# Patient Record
Sex: Male | Born: 1965 | Race: Black or African American | Hispanic: No | Marital: Single | Smoking: Current every day smoker
Health system: Southern US, Community
[De-identification: ages and names within clinical notes are randomized; demographics above are authoritative.]

## PROBLEM LIST (undated history)

## (undated) DIAGNOSIS — I10 Essential (primary) hypertension: Secondary | ICD-10-CM

## (undated) HISTORY — PX: NO PAST SURGERIES: SHX2092

---

## 2006-02-26 ENCOUNTER — Emergency Department (HOSPITAL_COMMUNITY): Admission: EM | Admit: 2006-02-26 | Discharge: 2006-02-26 | Payer: Self-pay | Admitting: Family Medicine

## 2014-08-28 ENCOUNTER — Encounter (HOSPITAL_COMMUNITY): Payer: Self-pay | Admitting: *Deleted

## 2014-08-28 ENCOUNTER — Emergency Department (INDEPENDENT_AMBULATORY_CARE_PROVIDER_SITE_OTHER)
Admission: EM | Admit: 2014-08-28 | Discharge: 2014-08-28 | Disposition: A | Discharge status: Home / Self Care | Payer: Medicaid Other | Source: Home / Self Care | Attending: Emergency Medicine | Admitting: Emergency Medicine

## 2014-08-28 ENCOUNTER — Emergency Department (INDEPENDENT_AMBULATORY_CARE_PROVIDER_SITE_OTHER): Payer: Medicaid Other

## 2014-08-28 DIAGNOSIS — M25471 Effusion, right ankle: Secondary | ICD-10-CM

## 2014-08-28 DIAGNOSIS — M25571 Pain in right ankle and joints of right foot: Secondary | ICD-10-CM

## 2014-08-28 DIAGNOSIS — G8929 Other chronic pain: Secondary | ICD-10-CM

## 2014-08-28 MED ORDER — CEPHALEXIN 500 MG PO CAPS: 500.0000 mg | ORAL_CAPSULE | Freq: Three times a day (TID) | ORAL | Status: AC

## 2014-08-28 MED ORDER — PREDNISONE 5 MG PO TABS: ORAL_TABLET | ORAL | Status: DC

## 2014-08-28 NOTE — ED Notes (Signed)
Denies injury.  C/O swelling to right medial ankle x approx 4 wks.  Swelling noted along with erythema into distal part of lower leg.  C/O minimal pain.  Denies fevers.  States toes feel fine.

## 2014-08-28 NOTE — ED Provider Notes (Signed)
CSN: [id number]     Arrival date & time 08/28/14  1254 History   First MD Initiated Contact with Patient 08/28/14 1348     Chief Complaint  Patient presents with  . Ankle Problem   (Consider location/radiation/quality/duration/timing/severity/associated sxs/prior Treatment) HPI Comments: Brett Grant presents with a right red and swollen ankle x 4 weeks. No known injury. "Sore" developed on the right ankle for weeks. Has been doing quite a bit of walking for exercise. No fever or chills. Pain with walking or ambulating. No nausea or vomiting.   The history is provided by the patient.    History reviewed. No pertinent past medical history. History reviewed. No pertinent past surgical history. No family history on file. History  Substance Use Topics  . Smoking status: Current Every Day Smoker -- 1.00 packs/day    Types: Cigarettes  . Smokeless tobacco: Not on file  . Alcohol Use: Yes     Comment: occasionally    Review of Systems  All other systems reviewed and are negative.   Allergies  Review of patient's allergies indicates no known allergies.  Home Medications   Prior to Admission medications   Medication Sig Start Date End Date Taking? Authorizing Provider  cephALEXin (KEFLEX) 500 MG capsule Take 1 capsule (500 mg total) by mouth 3 (three) times daily. 08/28/14 09/03/14  Riki Sheer, PA-C  predniSONE (DELTASONE) 5 MG tablet 3 tablets po x 3 days, then 2 tablets po x 3 days then 1 tablet po x 3 days then stop 08/28/14   Riki Sheer, PA-C   BP 169/82 mmHg  Pulse 107  Temp(Src) 99.5 F (37.5 C) (Oral)  Resp 16  SpO2 100% Physical Exam  Constitutional: He is oriented to person, place, and time. He appears well-developed and well-nourished. No distress.  HENT:  Head: Normocephalic and atraumatic.  Pulmonary/Chest: Effort normal.  Musculoskeletal:  Right ankle edematous, with erythema and mild warmth. Pain to palpation along the lateral aspect. Full ROM with pain   Neurological: He is alert and oriented to person, place, and time.  Skin: Skin is warm. No rash noted. He is not diaphoretic. There is erythema.  Small open sore to the right foot just distal to ankle joint. Non-infectious appearing.   Psychiatric: His behavior is normal.  Nursing note and vitals reviewed.   ED Course  Procedures (including critical care time) Labs Review Labs Reviewed - No data to display  Imaging Review Dg Ankle Complete Right  08/28/2014   CLINICAL DATA:  Right ankle swelling and soreness for 4 weeks. No known injury.  EXAM: RIGHT ANKLE - COMPLETE 3+ VIEW  COMPARISON:  None.  FINDINGS: No acute bony or joint abnormality is identified. Soft tissues about the ankle appear somewhat swollen. No radiopaque foreign body or soft tissue gas collection is seen. Cortex of the distal diaphysis of the tibia anteriorly appears thickened.  IMPRESSION: Mild appearing soft tissue about the ankle without underlying bony or joint abnormality.  Partial visualization of cortical thickening in the anterior distal tibia may be secondary to old stress change or fracture.   Electronically Signed   By: Drusilla Kanner M.D.   On: 08/28/2014 14:17     MDM   1. Ankle pain, chronic, right   2. Ankle swelling, right    Xray suggest old fracture or cortical irregularity, though presentation appeared most consistent with Gout vs. Infection. He had a small sore to the top of foot which does not appear infected but concerned. Will  treat with both Prednisone and cover with antibiotic. However will need f/u on Monday to ensure no worsening; or signs of infection. He is instructed to call with any worsening or signs of infection.     Riki SheerMichelle G Mayme Profeta, PA-C 08/28/14 1454

## 2014-08-28 NOTE — Discharge Instructions (Signed)

## 2014-08-30 ENCOUNTER — Emergency Department (INDEPENDENT_AMBULATORY_CARE_PROVIDER_SITE_OTHER)
Admission: EM | Admit: 2014-08-30 | Discharge: 2014-08-30 | Disposition: A | Discharge status: Home / Self Care | Payer: Medicaid Other | Source: Home / Self Care | Attending: Family Medicine | Admitting: Family Medicine

## 2014-08-30 ENCOUNTER — Encounter (HOSPITAL_COMMUNITY): Payer: Self-pay | Admitting: Emergency Medicine

## 2014-08-30 DIAGNOSIS — L03115 Cellulitis of right lower limb: Secondary | ICD-10-CM

## 2014-08-30 NOTE — Discharge Instructions (Signed)

## 2014-08-30 NOTE — ED Provider Notes (Signed)
CSN: [id number]     Arrival date & time 08/30/14  1446 History   First MD Initiated Contact with Patient 08/30/14 1641     Chief Complaint  Patient presents with  . Wound Check   (Consider location/radiation/quality/duration/timing/severity/associated sxs/prior Treatment) HPI Comments: [facility] ankle pain, swelling & erythema. Ddx: gout vs cellulitis. Treated with prednisone and cephalexin and returns as advised for re-check. Patient reports significant improvement and taking medications as directed without difficulty.    The history is provided by the patient.    History reviewed. No pertinent past medical history. History reviewed. No pertinent past surgical history. History reviewed. No pertinent family history. History  Substance Use Topics  . Smoking status: Current Every Day Smoker -- 1.00 packs/day    Types: Cigarettes  . Smokeless tobacco: Not on file  . Alcohol Use: Yes     Comment: occasionally    Review of Systems  All other systems reviewed and are negative.   Allergies  Review of patient's allergies indicates no known allergies.  Home Medications   Prior to Admission medications   Medication Sig Start Date End Date Taking? Authorizing Provider  cephALEXin (KEFLEX) 500 MG capsule Take 1 capsule (500 mg total) by mouth 3 (three) times daily. 08/28/14 09/03/14 Yes Riki SheerMichelle G Young, PA-C  predniSONE (DELTASONE) 5 MG tablet 3 tablets po x 3 days, then 2 tablets po x 3 days then 1 tablet po x 3 days then stop 08/28/14  Yes Michelle G Young, PA-C   BP 130/90 mmHg  Pulse 87  Temp(Src) 98.6 F (37 C) (Oral)  Resp 16  SpO2 100% Physical Exam  Constitutional: He is oriented to person, place, and time. He appears well-developed and well-nourished. No distress.  HENT:  Head: Normocephalic and atraumatic.  Cardiovascular: Normal rate.   Pulmonary/Chest: Effort normal.  Musculoskeletal:       Feet:  Neurological: He is alert and oriented  to person, place, and time.  Skin: Skin is warm and dry. No rash noted. There is erythema.  Psychiatric: He has a normal mood and affect. His behavior is normal.  Nursing note and vitals reviewed.   ED Course  Procedures (including critical care time) Labs Review Labs Reviewed - No data to display  Imaging Review No results found.   MDM   1. Cellulitis of right ankle   Continue current management and return as needed.     Ria ClockJennifer Lee H Danelle Curiale, PA 08/31/14 0900

## 2014-08-30 NOTE — ED Notes (Signed)
Pt was seen on 08/28/2014 for wound. Pt is here for a wound re-check follow up

## 2014-10-16 ENCOUNTER — Emergency Department (HOSPITAL_COMMUNITY)
Admission: EM | Admit: 2014-10-16 | Discharge: 2014-10-16 | Disposition: A | Discharge status: Home / Self Care | Payer: Medicaid Other | Source: Home / Self Care | Attending: Family Medicine | Admitting: Family Medicine

## 2014-10-16 ENCOUNTER — Encounter (HOSPITAL_COMMUNITY): Payer: Self-pay | Admitting: *Deleted

## 2014-10-16 DIAGNOSIS — R600 Localized edema: Secondary | ICD-10-CM

## 2014-10-16 DIAGNOSIS — S91301A Unspecified open wound, right foot, initial encounter: Secondary | ICD-10-CM

## 2014-10-16 MED ORDER — SILVER SULFADIAZINE 1 % EX CREA
TOPICAL_CREAM | CUTANEOUS | Status: AC
  Filled 2014-10-16: qty 85

## 2014-10-16 MED ORDER — SILVER SULFADIAZINE 1 % EX CREA: 1.0000 "application " | TOPICAL_CREAM | Freq: Every day | CUTANEOUS | Status: DC

## 2014-10-16 MED ORDER — CEPHALEXIN 500 MG PO CAPS: 500.0000 mg | ORAL_CAPSULE | Freq: Four times a day (QID) | ORAL | Status: DC

## 2014-10-16 MED ORDER — SILVER SULFADIAZINE 1 % EX CREA
TOPICAL_CREAM | Freq: Once | CUTANEOUS | Status: AC
  Administered 2014-10-16: 15:00:00 via TOPICAL
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
## 2014-10-16 NOTE — Discharge Instructions (Signed)
Edema Edema is an abnormal buildup of fluids. It is more common in your legs and thighs. Painless swelling of the feet and ankles is more likely as a person ages. It also is common in looser skin, like around your eyes. HOME CARE   Keep the affected body part above the level of the heart while lying down.  Do not sit still or stand for a long time.  Do not put anything right under your knees when you lie down.  Do not wear tight clothes on your upper legs.  Exercise your legs to help the puffiness (swelling) go down.  Wear elastic bandages or support stockings as told by your doctor.  A low-salt diet may help lessen the puffiness.  Only take medicine as told by your doctor. GET HELP IF:  Treatment is not working.  You have heart, liver, or kidney disease and notice that your skin looks puffy or shiny.  You have puffiness in your legs that does not get better when you raise your legs.  You have sudden weight gain for no reason. GET HELP RIGHT AWAY IF:   You have shortness of breath or chest pain.  You cannot breathe when you lie down.  You have pain, redness, or warmth in the areas that are puffy.  You have heart, liver, or kidney disease and get edema all of a sudden.  You have a fever and your symptoms get worse all of a sudden. MAKE SURE YOU:   Understand these instructions.  Will watch your condition.  Will get help right away if you are not doing well or get worse. Document Released: 01/02/2008 Document Revised: 07/21/2013 Document Reviewed: 05/08/2013 [facility] Patient Information 2015 [place], [place]. This information is not intended to replace advice given to you by your health care provider. Make sure you discuss any questions you have with your health care provider.  Wound Care Wound care helps prevent pain and infection.  You may need a tetanus shot if:  You cannot remember when you had your last tetanus shot.  You have never had a tetanus shot.  The  injury broke your skin. If you need a tetanus shot and you choose not to have one, you may get tetanus. Sickness from tetanus can be serious. HOME CARE   Only take medicine as told by your doctor.  Clean the wound daily with mild soap and water.  Change any bandages (dressings) as told by your doctor.  Put medicated cream and a bandage on the wound as told by your doctor.  Change the bandage if it gets wet, dirty, or starts to smell.  Take showers. Do not take baths, swim, or do anything that puts your wound under water. No hot soaks.  Rest and raise (elevate) the wound until the pain and puffiness (swelling) are better.  Keep all doctor visits as told. GET HELP RIGHT AWAY IF:   Yellowish-white fluid (pus) comes from the wound.  Medicine does not lessen your pain.  There is a red streak going away from the wound.  You have a fever. MAKE SURE YOU:   Understand these instructions.  Will watch your condition.  Will get help right away if you are not doing well or get worse. Document Released: 04/24/2008 Document Revised: 10/08/2011 Document Reviewed: 11/19/2010 [facility] Patient Information 2015 [place], [place]. This information is not intended to replace advice given to you by your health care provider. Make sure you discuss any questions you have with your health care  provider.

## 2014-10-21 ENCOUNTER — Encounter (HOSPITAL_COMMUNITY): Payer: Self-pay | Admitting: *Deleted

## 2014-10-21 ENCOUNTER — Emergency Department (HOSPITAL_COMMUNITY)
Admission: EM | Admit: 2014-10-21 | Discharge: 2014-10-21 | Disposition: A | Discharge status: Home / Self Care | Payer: Medicaid Other | Source: Home / Self Care | Attending: Family Medicine | Admitting: Family Medicine

## 2014-10-21 DIAGNOSIS — IMO0001 Reserved for inherently not codable concepts without codable children: Secondary | ICD-10-CM

## 2014-10-21 DIAGNOSIS — R030 Elevated blood-pressure reading, without diagnosis of hypertension: Secondary | ICD-10-CM

## 2014-10-21 DIAGNOSIS — L03115 Cellulitis of right lower limb: Secondary | ICD-10-CM | POA: Diagnosis not present

## 2014-10-21 MED ORDER — TETANUS-DIPHTH-ACELL PERTUSSIS 5-2.5-18.5 LF-MCG/0.5 IM SUSP
INTRAMUSCULAR | Status: AC
  Filled 2014-10-21: qty 0.5

## 2014-10-21 MED ORDER — TETANUS-DIPHTH-ACELL PERTUSSIS 5-2.5-18.5 LF-MCG/0.5 IM SUSP
0.5000 mL | Freq: Once | INTRAMUSCULAR | Status: AC
  Administered 2014-10-21: 0.5 mL via INTRAMUSCULAR

## 2014-10-21 NOTE — ED Notes (Addendum)
Brother [facility]  They told him they don't accept medicaid.  He using Silvadene once a day and taking Keflex.  Brother helping with dressing changes.  Ankle and lower leg still swollen.

## 2014-10-21 NOTE — Discharge Instructions (Signed)
As we discussed, it would be my best advice for you to allow us to transfer you to [facility] [facility] tonight to seek additional evaluationt as your have done everything that was asked of you and taken medication as directed and condition has not improved. In addition, there was an abnormal finding on your xrays on Jan. 30, 2016. A portion of the bone in your lower leg was much thicker that it should be and I would recommend additional xrays to further evaluate this. Your blood pressure is also quite elevated and this also needs to be addressed as soon as possible. You have decided not to be evaluated at ER tonight, therefore, I can only encourage you to seek follow up with your primary care doctor as soon as possible.  Cellulitis Cellulitis is an infection of the skin and the tissue beneath it. The infected area is usually red and tender. Cellulitis occurs most often in the arms and lower legs.  CAUSES  Cellulitis is caused by bacteria that enter the skin through cracks or cuts in the skin. The most common types of bacteria that cause cellulitis are staphylococci and streptococci. SIGNS AND SYMPTOMS   Redness and warmth.  Swelling.  Tenderness or pain.  Fever. DIAGNOSIS  Your health care provider can usually determine what is wrong based on a physical exam. Blood tests may also be done. TREATMENT  Treatment usually involves taking an antibiotic medicine. HOME CARE INSTRUCTIONS   Take your antibiotic medicine as directed by your health care provider. Finish the antibiotic even if you start to feel better.  Keep the infected arm or leg elevated to reduce swelling.  Apply a warm cloth to the affected area up to 4 times per day to relieve pain.  Take medicines only as directed by your health care provider.  Keep all follow-up visits as directed by your health care provider. SEEK MEDICAL CARE IF:   You notice red streaks coming from the infected area.  Your red area gets larger or turns dark  in color.  Your bone or joint underneath the infected area becomes painful after the skin has healed.  Your infection returns in the same area or another area.  You notice a swollen bump in the infected area.  You develop new symptoms.  You have a fever. SEEK IMMEDIATE MEDICAL CARE IF:   You feel very sleepy.  You develop vomiting or diarrhea.  You have a general ill feeling (malaise) with muscle aches and pains. MAKE SURE YOU:   Understand these instructions.  Will watch your condition.  Will get help right away if you are not doing well or get worse. Document Released: 04/25/2005 Document Revised: 11/30/2013 Document Reviewed: 10/01/2011 [facility] Patient Information 2015 [place], [place]. This information is not intended to replace advice given to you by your health care provider. Make sure you discuss any questions you have with your health care provider.  Hypertension Hypertension, commonly called high blood pressure, is when the force of blood pumping through your arteries is too strong. Your arteries are the blood vessels that carry blood from your heart throughout your body. A blood pressure reading consists of a higher number over a lower number, such as 110/72. The higher number (systolic) is the pressure inside your arteries when your heart pumps. The lower number (diastolic) is the pressure inside your arteries when your heart relaxes. Ideally you want your blood pressure below 120/80. Hypertension forces your heart to work harder to pump blood. Your arteries may  become narrow or stiff. Having hypertension puts you at risk for heart disease, stroke, and other problems.  RISK FACTORS Some risk factors for high blood pressure are controllable. Others are not.  Risk factors you cannot control include:   Race. You may be at higher risk if you are African American.  Age. Risk increases with age.  Gender. Men are at higher risk than women before age 50 years. After age 63,  women are at higher risk than men. Risk factors you can control include:  Not getting enough exercise or physical activity.  Being overweight.  Getting too much fat, sugar, calories, or salt in your diet.  Drinking too much alcohol. SIGNS AND SYMPTOMS Hypertension does not usually cause signs or symptoms. Extremely high blood pressure (hypertensive crisis) may cause headache, anxiety, shortness of breath, and nosebleed. DIAGNOSIS  To check if you have hypertension, your health care provider will measure your blood pressure while you are seated, with your arm held at the level of your heart. It should be measured at least twice using the same arm. Certain conditions can cause a difference in blood pressure between your right and left arms. A blood pressure reading that is higher than normal on one occasion does not mean that you need treatment. If one blood pressure reading is high, ask your health care provider about having it checked again. TREATMENT  Treating high blood pressure includes making lifestyle changes and possibly taking medicine. Living a healthy lifestyle can help lower high blood pressure. You may need to change some of your habits. Lifestyle changes may include:  Following the DASH diet. This diet is high in fruits, vegetables, and whole grains. It is low in salt, red meat, and added sugars.  Getting at least 2 hours of brisk physical activity every week.  Losing weight if necessary.  Not smoking.  Limiting alcoholic beverages.  Learning ways to reduce stress. If lifestyle changes are not enough to get your blood pressure under control, your health care provider may prescribe medicine. You may need to take more than one. Work closely with your health care provider to understand the risks and benefits. HOME CARE INSTRUCTIONS  Have your blood pressure rechecked as directed by your health care provider.   Take medicines only as directed by your health care provider.  Follow the directions carefully. Blood pressure medicines must be taken as prescribed. The medicine does not work as well when you skip doses. Skipping doses also puts you at risk for problems.   Do not smoke.   Monitor your blood pressure at home as directed by your health care provider. SEEK MEDICAL CARE IF:   You think you are having a reaction to medicines taken.  You have recurrent headaches or feel dizzy.  You have swelling in your ankles.  You have trouble with your vision. SEEK IMMEDIATE MEDICAL CARE IF:  You develop a severe headache or confusion.  You have unusual weakness, numbness, or feel faint.  You have severe chest or abdominal pain.  You vomit repeatedly.  You have trouble breathing. MAKE SURE YOU:   Understand these instructions.  Will watch your condition.  Will get help right away if you are not doing well or get worse. Document Released: 07/16/2005 Document Revised: 11/30/2013 Document Reviewed: 05/08/2013 [facility] Patient Information 2015 [place], [place]. This information is not intended to replace advice given to you by your health care provider. Make sure you discuss any questions you have with your health care provider.

## 2014-10-21 NOTE — ED Provider Notes (Addendum)
CSN: [id number]     Arrival date & time 10/21/14  1833 History   First MD Initiated Contact with Patient 10/21/14 1941     Chief Complaint  Patient presents with  . Wound Check   (Consider location/radiation/quality/duration/timing/severity/associated sxs/prior Treatment) HPI Comments: Patient presents with persistent right lower leg pain, redness and swelling. Symptoms initially began in early Jan. 2016 and have persisted despite treatment with oral steroids, two rounds of oral antibiotics, silvadene dressings, elevation, warm soaks and compresses. Brother is patient's caregiver and has helped patient take care of patient. They have attempted to seek follow up care at patient's PCP office, however, office has informed them that office will no longer accept Medicaid patients.  Old records reviewed and films from Jan. 30, 2016 are notable for significant cortical thickening of tibia.   The history is provided by the patient and a relative.    History reviewed. No pertinent past medical history. History reviewed. No pertinent past surgical history. Family History  Problem Relation Age of Onset  . Cancer Mother     throat   History  Substance Use Topics  . Smoking status: Current Every Day Smoker -- 0.50 packs/day    Types: Cigarettes  . Smokeless tobacco: Not on file  . Alcohol Use: No    Review of Systems  All other systems reviewed and are negative.   Allergies  Review of patient's allergies indicates no known allergies.  Home Medications   Prior to Admission medications   Medication Sig Start Date End Date Taking? Authorizing Provider  cephALEXin (KEFLEX) 500 MG capsule Take 1 capsule (500 mg total) by mouth 4 (four) times daily. 10/16/14  Yes Hayden Rasmussenavid Mabe, NP  silver sulfADIAZINE (SILVADENE) 1 % cream Apply 1 application topically daily. 10/16/14  Yes Hayden Rasmussenavid Mabe, NP  hydrochlorothiazide (HYDRODIURIL) 25 MG tablet Take 1 tablet (25 mg total) by mouth daily. 10/22/14   Shon Batonourtney  F Horton, MD  lisinopril (PRINIVIL,ZESTRIL) 20 MG tablet Take 1 tablet (20 mg total) by mouth daily. 10/22/14   Shon Batonourtney F Horton, MD   BP 166/105 mmHg  Pulse 88  Temp(Src) 98.5 F (36.9 C) (Oral)  Resp 20  SpO2 100% Physical Exam  Constitutional: He is oriented to person, place, and time. He appears well-developed and well-nourished. No distress.  HENT:  Head: Normocephalic and atraumatic.  Eyes: Conjunctivae are normal.  Cardiovascular: Normal rate.   Pulmonary/Chest: Effort normal.  Musculoskeletal: Normal range of motion.       Right lower leg: He exhibits tenderness, swelling and edema.       Legs: Outlined area is region of 2+ tissue edema, erythema with mild induration and tenderness  Neurological: He is alert and oriented to person, place, and time.  Skin: Skin is warm and dry. There is erythema.  Psychiatric: He has a normal mood and affect. His behavior is normal.  Nursing note and vitals reviewed.   ED Course  Procedures (including critical care time) Labs Review Labs Reviewed - No data to display  Imaging Review No results found.   MDM   1. Cellulitis of right lower leg   2. Elevated blood pressure    As we discussed, it would be my best advice for you to allow us to [facility] to seek additional evaluationt as your have done everything that was asked of you and taken medication as directed and condition has not improved. In addition, there was an abnormal finding on your xrays on Jan.  30, 2016. A portion of the bone in your lower leg was much thicker that it should be and I would recommend additional xrays to further evaluate this. Your blood pressure is also quite elevated and this also needs to be addressed as soon as possible. You have decided not to be evaluated at ER tonight, therefore, I can only encourage you to seek follow up with your primary care doctor as soon as possible.   Ria Clock, [place] 10/22/14  1610  12/01/2014: Tdap administered on [address] [place], [place] 12/01/14 1217

## 2014-10-22 ENCOUNTER — Emergency Department (HOSPITAL_COMMUNITY)
Admission: EM | Admit: 2014-10-22 | Discharge: 2014-10-22 | Disposition: A | Discharge status: Home / Self Care | Payer: Medicaid Other | Attending: Emergency Medicine | Admitting: Emergency Medicine

## 2014-10-22 ENCOUNTER — Encounter (HOSPITAL_COMMUNITY): Payer: Self-pay | Admitting: Emergency Medicine

## 2014-10-22 DIAGNOSIS — Z720 Tobacco use: Secondary | ICD-10-CM | POA: Diagnosis not present

## 2014-10-22 DIAGNOSIS — I10 Essential (primary) hypertension: Secondary | ICD-10-CM | POA: Insufficient documentation

## 2014-10-22 DIAGNOSIS — Z792 Long term (current) use of antibiotics: Secondary | ICD-10-CM | POA: Insufficient documentation

## 2014-10-22 LAB — BASIC METABOLIC PANEL
Anion gap: 6 (ref 5–15)
BUN: 8 mg/dL (ref 6–23)
CO2: 27 mmol/L (ref 19–32)
CREATININE: 0.72 mg/dL (ref 0.50–1.35)
Calcium: 9.6 mg/dL (ref 8.4–10.5)
Chloride: 105 mmol/L (ref 96–112)
GFR calc non Af Amer: >90 mL/min (ref >90)
Glucose, Bld: 96 mg/dL (ref 70–99)
POTASSIUM: 3.3 mmol/L — AB (ref 3.5–5.1)
Sodium: 138 mmol/L (ref 135–145)

## 2014-10-22 MED ORDER — HYDROCHLOROTHIAZIDE 25 MG PO TABS: 25.0000 mg | ORAL_TABLET | Freq: Every day | ORAL | Status: DC

## 2014-10-22 MED ORDER — LISINOPRIL 20 MG PO TABS: 20.0000 mg | ORAL_TABLET | Freq: Every day | ORAL | Status: DC
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
## 2014-10-22 NOTE — Discharge Instructions (Signed)
You were seen today for high blood pressure. You have no symptoms related to your high blood pressure. However, your EKG does have changes likely related to hypertension.  He will be given 2 prescriptions for medications. You need to follow-up within the next week with her primary doctor. He will only be given 15 days of medication.  Hypertension Hypertension, commonly called high blood pressure, is when the force of blood pumping through your arteries is too strong. Your arteries are the blood vessels that carry blood from your heart throughout your body. A blood pressure reading consists of a higher number over a lower number, such as 110/72. The higher number (systolic) is the pressure inside your arteries when your heart pumps. The lower number (diastolic) is the pressure inside your arteries when your heart relaxes. Ideally you want your blood pressure below 120/80. Hypertension forces your heart to work harder to pump blood. Your arteries may become narrow or stiff. Having hypertension puts you at risk for heart disease, stroke, and other problems.  RISK FACTORS Some risk factors for high blood pressure are controllable. Others are not.  Risk factors you cannot control include:   Race. You may be at higher risk if you are African American.  Age. Risk increases with age.  Gender. Men are at higher risk than women before age 48 years. After age 48, women are at higher risk than men. Risk factors you can control include:  Not getting enough exercise or physical activity.  Being overweight.  Getting too much fat, sugar, calories, or salt in your diet.  Drinking too much alcohol. SIGNS AND SYMPTOMS Hypertension does not usually cause signs or symptoms. Extremely high blood pressure (hypertensive crisis) may cause headache, anxiety, shortness of breath, and nosebleed. DIAGNOSIS  To check if you have hypertension, your health care provider will measure your blood pressure while you are  seated, with your arm held at the level of your heart. It should be measured at least twice using the same arm. Certain conditions can cause a difference in blood pressure between your right and left arms. A blood pressure reading that is higher than normal on one occasion does not mean that you need treatment. If one blood pressure reading is high, ask your health care provider about having it checked again. TREATMENT  Treating high blood pressure includes making lifestyle changes and possibly taking medicine. Living a healthy lifestyle can help lower high blood pressure. You may need to change some of your habits. Lifestyle changes may include:  Following the DASH diet. This diet is high in fruits, vegetables, and whole grains. It is low in salt, red meat, and added sugars.  Getting at least 2 hours of brisk physical activity every week.  Losing weight if necessary.  Not smoking.  Limiting alcoholic beverages.  Learning ways to reduce stress. If lifestyle changes are not enough to get your blood pressure under control, your health care provider may prescribe medicine. You may need to take more than one. Work closely with your health care provider to understand the risks and benefits. HOME CARE INSTRUCTIONS  Have your blood pressure rechecked as directed by your health care provider.   Take medicines only as directed by your health care provider. Follow the directions carefully. Blood pressure medicines must be taken as prescribed. The medicine does not work as well when you skip doses. Skipping doses also puts you at risk for problems.   Do not smoke.   Monitor your blood pressure at  home as directed by your health care provider. SEEK MEDICAL CARE IF:   You think you are having a reaction to medicines taken.  You have recurrent headaches or feel dizzy.  You have swelling in your ankles.  You have trouble with your vision. SEEK IMMEDIATE MEDICAL CARE IF:  You develop a  severe headache or confusion.  You have unusual weakness, numbness, or feel faint.  You have severe chest or abdominal pain.  You vomit repeatedly.  You have trouble breathing. MAKE SURE YOU:   Understand these instructions.  Will watch your condition.  Will get help right away if you are not doing well or get worse. Document Released: 07/16/2005 Document Revised: 11/30/2013 Document Reviewed: 05/08/2013 [facility] Patient Information 2015 [place], [place]. This information is not intended to replace advice given to you by your health care provider. Make sure you discuss any questions you have with your health care provider.
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
## 2014-11-01 NOTE — Progress Notes (Signed)
11/01/2014: Addendum to DOS 10/21/2014. Patient given Tdap booster for tetanus prophylaxis on DOS given existing wounds and unknown immunization status.

## 2014-11-04 ENCOUNTER — Emergency Department (HOSPITAL_COMMUNITY)
Admission: EM | Admit: 2014-11-04 | Discharge: 2014-11-04 | Disposition: A | Discharge status: Home / Self Care | Payer: Medicaid Other | Attending: Emergency Medicine | Admitting: Emergency Medicine

## 2014-11-04 ENCOUNTER — Encounter (HOSPITAL_COMMUNITY): Payer: Self-pay | Admitting: Physical Medicine and Rehabilitation

## 2014-11-04 ENCOUNTER — Emergency Department (HOSPITAL_COMMUNITY): Payer: Medicaid Other

## 2014-11-04 DIAGNOSIS — Z792 Long term (current) use of antibiotics: Secondary | ICD-10-CM | POA: Diagnosis not present

## 2014-11-04 DIAGNOSIS — Z720 Tobacco use: Secondary | ICD-10-CM | POA: Insufficient documentation

## 2014-11-04 DIAGNOSIS — M25571 Pain in right ankle and joints of right foot: Secondary | ICD-10-CM | POA: Diagnosis not present

## 2014-11-04 DIAGNOSIS — M79671 Pain in right foot: Secondary | ICD-10-CM | POA: Diagnosis not present

## 2014-11-04 DIAGNOSIS — I10 Essential (primary) hypertension: Secondary | ICD-10-CM | POA: Insufficient documentation

## 2014-11-04 DIAGNOSIS — I159 Secondary hypertension, unspecified: Secondary | ICD-10-CM

## 2014-11-04 DIAGNOSIS — Z79899 Other long term (current) drug therapy: Secondary | ICD-10-CM | POA: Insufficient documentation

## 2014-11-04 DIAGNOSIS — Z760 Encounter for issue of repeat prescription: Secondary | ICD-10-CM | POA: Diagnosis not present

## 2014-11-04 DIAGNOSIS — R52 Pain, unspecified: Secondary | ICD-10-CM

## 2014-11-04 HISTORY — DX: Essential (primary) hypertension: I10

## 2014-11-04 MED ORDER — HYDROCHLOROTHIAZIDE 25 MG PO TABS: 25.0000 mg | ORAL_TABLET | Freq: Every day | ORAL | Status: DC

## 2014-11-04 NOTE — ED Notes (Signed)
Pt presents to department for evaluation of medication refll (HCTZ). Also states L ankle pain, denies recent injury. Pt ambulatory to triage. NAD.

## 2014-11-04 NOTE — ED Notes (Signed)
Patient in radiology
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
## 2014-11-17 ENCOUNTER — Ambulatory Visit: Payer: Medicaid Other | Attending: Family Medicine | Admitting: Family Medicine

## 2014-11-17 ENCOUNTER — Encounter: Payer: Self-pay | Admitting: Family Medicine

## 2014-11-17 ENCOUNTER — Encounter: Payer: Self-pay | Admitting: *Deleted

## 2014-11-17 VITALS — BP 162/93 | HR 77 | Temp 97.7°F | Resp 18 | Ht 71.0 in | Wt 151.8 lb

## 2014-11-17 DIAGNOSIS — F17210 Nicotine dependence, cigarettes, uncomplicated: Secondary | ICD-10-CM | POA: Insufficient documentation

## 2014-11-17 DIAGNOSIS — R609 Edema, unspecified: Secondary | ICD-10-CM | POA: Diagnosis present

## 2014-11-17 DIAGNOSIS — M25471 Effusion, right ankle: Secondary | ICD-10-CM

## 2014-11-17 DIAGNOSIS — I10 Essential (primary) hypertension: Secondary | ICD-10-CM | POA: Diagnosis not present

## 2014-11-17 DIAGNOSIS — L539 Erythematous condition, unspecified: Secondary | ICD-10-CM | POA: Insufficient documentation

## 2014-11-17 DIAGNOSIS — M7989 Other specified soft tissue disorders: Secondary | ICD-10-CM

## 2014-11-17 DIAGNOSIS — Z720 Tobacco use: Secondary | ICD-10-CM | POA: Diagnosis not present

## 2014-11-17 LAB — COMPLETE METABOLIC PANEL WITH GFR
ALBUMIN: 4.4 g/dL (ref 3.5–5.2)
ALT: 14 U/L (ref 0–53)
AST: 17 U/L (ref 0–37)
Alkaline Phosphatase: 51 U/L (ref 39–117)
BUN: 8 mg/dL (ref 6–23)
CALCIUM: 9.7 mg/dL (ref 8.4–10.5)
CO2: 26 mEq/L (ref 19–32)
Chloride: 100 mEq/L (ref 96–112)
Creat: 0.71 mg/dL (ref 0.50–1.35)
GFR, Est African American: >89 mL/min
GFR, Est Non African American: >89 mL/min
GLUCOSE: 86 mg/dL (ref 70–99)
POTASSIUM: 3.6 meq/L (ref 3.5–5.3)
Sodium: 139 mEq/L (ref 135–145)
Total Bilirubin: 0.4 mg/dL (ref 0.2–1.2)
Total Protein: 7.1 g/dL (ref 6.0–8.3)

## 2014-11-17 LAB — CBC WITH DIFFERENTIAL/PLATELET
Basophils Absolute: 0 10*3/uL (ref 0.0–0.1)
Basophils Relative: 0 % (ref 0–1)
Eosinophils Absolute: 0.1 10*3/uL (ref 0.0–0.7)
Eosinophils Relative: 1 % (ref 0–5)
HEMATOCRIT: 38.8 % — AB (ref 39.0–52.0)
Hemoglobin: 13.3 g/dL (ref 13.0–17.0)
LYMPHS PCT: 15 % (ref 12–46)
Lymphs Abs: 1.8 10*3/uL (ref 0.7–4.0)
MCH: 29.4 pg (ref 26.0–34.0)
MCHC: 34.3 g/dL (ref 30.0–36.0)
MCV: 85.8 fL (ref 78.0–100.0)
MONOS PCT: 5 % (ref 3–12)
MPV: 8.6 fL (ref 8.6–12.4)
Monocytes Absolute: 0.6 10*3/uL (ref 0.1–1.0)
NEUTROS ABS: 9.4 10*3/uL — AB (ref 1.7–7.7)
NEUTROS PCT: 79 % — AB (ref 43–77)
Platelets: 358 10*3/uL (ref 150–400)
RBC: 4.52 MIL/uL (ref 4.22–5.81)
RDW: 12.9 % (ref 11.5–15.5)
WBC: 11.9 10*3/uL — AB (ref 4.0–10.5)

## 2014-11-17 LAB — LIPID PANEL
Cholesterol: 127 mg/dL (ref 0–200)
HDL: 52 mg/dL (ref >=40)
LDL CALC: 61 mg/dL (ref 0–99)
Total CHOL/HDL Ratio: 2.4 Ratio
Triglycerides: 72 mg/dL (ref <150)
VLDL: 14 mg/dL (ref 0–40)

## 2014-11-17 LAB — POCT GLYCOSYLATED HEMOGLOBIN (HGB A1C): HEMOGLOBIN A1C: 6

## 2014-11-17 LAB — C-REACTIVE PROTEIN: CRP: 0.5 mg/dL (ref <0.60)

## 2014-11-17 LAB — URIC ACID: Uric Acid, Serum: 6.2 mg/dL (ref 4.0–7.8)

## 2014-11-17 LAB — GLUCOSE, POCT (MANUAL RESULT ENTRY): POC GLUCOSE: 105 mg/dL — AB (ref 70–99)

## 2014-11-17 MED ORDER — AMLODIPINE BESYLATE 10 MG PO TABS: 10.0000 mg | ORAL_TABLET | Freq: Every day | ORAL | Status: DC

## 2014-11-17 MED ORDER — TERBINAFINE HCL 1 % EX CREA: 1.0000 "application " | TOPICAL_CREAM | Freq: Two times a day (BID) | CUTANEOUS | Status: DC

## 2014-11-17 MED ORDER — METHYLPREDNISOLONE 4 MG PO TBPK: ORAL_TABLET | ORAL | Status: DC

## 2014-11-17 MED ORDER — NAPROXEN 500 MG PO TABS: 500.0000 mg | ORAL_TABLET | Freq: Two times a day (BID) | ORAL | Status: DC

## 2014-11-17 NOTE — Progress Notes (Signed)
Lower extremity duplex right ordered on 11/17/14. Prior authorization needed.  [facility] [id number] effective 11/17/14-12/17/14.  Vascular called and given authorization number.  Patient scheduled for 11/18/14 at 1500. Patient aware of appointment time.

## 2014-11-17 NOTE — Progress Notes (Signed)
Patient here to establish care. Patient with multiple ED/Urgent Care visits for right ankle edema. Patient has had ankle edema x 3 months. No injury to ankle. Right ankle edema improved but remains. Per patient, he has completed 2 rounds of antibiotics. Uses ice to reduce swelling. Started on hydrochlorothiazide for elevated BP on 11/04/14.  Patient compliant with medication. BP elevated today 162/91.
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
## 2014-11-17 NOTE — Assessment & Plan Note (Addendum)
A: s/p 7 days course of keflex x 2 in 08/28/14  and 10/13/14. Evidence of maceration between toes concerning for tinea pedis P: Plan for today: Lab work Stop HCTZ in case this is gout Start steroid taper  Start naproxen 500 mg twice daily with food for next 7 days  lamisil cream  Venous ltrasound of leg ordered

## 2014-11-17 NOTE — Patient Instructions (Addendum)
Mr. Brett Grant,  Thank you for coming in today. It was a pleasure meeting you. I look forward to being your primary doctor.  1. R leg swelling for 3 months  Treated twice with antibiotics  Plan for today: Lab work Stop HCTZ in case this is gout Start steroid taper  Start naproxen 500 mg twice daily with food for next 7 days  Ultrasound of leg ordered   2. HTN: Goal BP < 140/90 at all times Start norvasc 10 mg daily   3. Smoking cessation support: smoking cessation hotline: 1-800-QUIT-NOW.  Smoking cessation classes are available through [facility]. Call [phone] or visit our website at [email or web address].  F/u in 3 weeks for R leg swelling   Dr. Armen PickupFunches

## 2014-11-17 NOTE — Assessment & Plan Note (Signed)
HTN: above goal Goal BP < 140/90 at all times  P: Stop HCTZ 25 mg  Start norvasc 10 mg daily

## 2014-11-17 NOTE — Assessment & Plan Note (Signed)
Smoking cessation support: smoking cessation hotline: 1-800-QUIT-NOW.  Smoking cessation classes are available through [facility]. Call [phone] or visit our website at [email or web address].   

## 2014-11-18 ENCOUNTER — Ambulatory Visit (HOSPITAL_COMMUNITY): Payer: Medicaid Other | Attending: Family Medicine

## 2014-11-18 LAB — SEDIMENTATION RATE: SED RATE: 12 mm/h (ref 0–15)

## 2014-11-19 ENCOUNTER — Telehealth: Payer: Self-pay | Admitting: *Deleted

## 2014-11-19 NOTE — Telephone Encounter (Signed)
Left voice message with Malvin for Pt to return call

## 2014-11-19 NOTE — Telephone Encounter (Signed)
-----   Message from Dessa PhiJosalyn Funches, MD sent at 11/19/2014  8:50 AM EDT ----- Normal labs, No evidence of gout, normal inflammatory markers  WBC slightly elevated  Patient needs venous dopplers.

## 2014-12-08 ENCOUNTER — Ambulatory Visit: Payer: Medicaid Other | Attending: Family Medicine | Admitting: Family Medicine

## 2014-12-08 ENCOUNTER — Encounter: Payer: Self-pay | Admitting: Family Medicine

## 2014-12-08 VITALS — BP 144/84 | HR 75 | Temp 97.5°F | Resp 16 | Ht 71.0 in | Wt 153.0 lb

## 2014-12-08 DIAGNOSIS — H6120 Impacted cerumen, unspecified ear: Secondary | ICD-10-CM | POA: Insufficient documentation

## 2014-12-08 DIAGNOSIS — H6123 Impacted cerumen, bilateral: Secondary | ICD-10-CM

## 2014-12-08 DIAGNOSIS — M7989 Other specified soft tissue disorders: Secondary | ICD-10-CM | POA: Insufficient documentation

## 2014-12-08 DIAGNOSIS — I10 Essential (primary) hypertension: Secondary | ICD-10-CM

## 2014-12-08 LAB — CBC
HCT: 39.8 % (ref 39.0–52.0)
Hemoglobin: 13.4 g/dL (ref 13.0–17.0)
MCH: 29.1 pg (ref 26.0–34.0)
MCHC: 33.7 g/dL (ref 30.0–36.0)
MCV: 86.3 fL (ref 78.0–100.0)
MPV: 8.8 fL (ref 8.6–12.4)
Platelets: 306 10*3/uL (ref 150–400)
RBC: 4.61 MIL/uL (ref 4.22–5.81)
RDW: 14.4 % (ref 11.5–15.5)
WBC: 9.7 10*3/uL (ref 4.0–10.5)

## 2014-12-08 MED ORDER — CARBAMIDE PEROXIDE 6.5 % OT SOLN: 5.0000 [drp] | Freq: Two times a day (BID) | OTIC | Status: DC
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
## 2014-12-08 NOTE — Progress Notes (Signed)
F/U Rt ankle swelling Still with pain Stated "can walk to the store with out much pain" Hx Tobacco- 3-4 cigarette per day

## 2014-12-08 NOTE — Patient Instructions (Addendum)
Mr. Ashley RoyaltyMatthews,  Thank you for coming in today  1. R leg swelling Repeat CBC and D dimer today Please have LE doppler done to check for blood clot  Continue naproxen as needed for pain Elevate legs when sitting   2. HTN: improved BP. Continue norvasc 10 mg once daily.  Work on quitting smoking completely Smoking cessation support: smoking cessation hotline: 1-800-QUIT-NOW.  Smoking cessation classes are available through [facility]. Call [phone] or visit our website at [email or web address].   3. Wax in ears 5 days of debrox Please do not  use of q tips  You will be called with lab results  F/u in 1-2 weeks with RN for ear flush  F/u with me in 6 weeks   Dr. Armen PickupFunches

## 2014-12-08 NOTE — Assessment & Plan Note (Addendum)
A: persistent swelling w/o fever despite course of prednisone followed by naproxen. Patient reports minimal pain.  Labs: No evidence of gout, normal inflammatory markers  WBC slightly elevated  Venous doppler: still pending, DNKA for doppler   P: Repeat CBC D dimer  Venous doppler

## 2014-12-08 NOTE — Assessment & Plan Note (Signed)
A: improved, near goal P:  Continue norvasc 10 mg daily  Smoking cessation counseling

## 2014-12-08 NOTE — Assessment & Plan Note (Signed)
A: wax in both ears P: 5 days of debrox Advised patient against the use of q tips F/u in 1-2 weeks with RN for ear flush

## 2014-12-09 ENCOUNTER — Encounter: Payer: Self-pay | Admitting: *Deleted

## 2014-12-09 ENCOUNTER — Other Ambulatory Visit: Payer: Self-pay | Admitting: Family Medicine

## 2014-12-09 DIAGNOSIS — M7989 Other specified soft tissue disorders: Secondary | ICD-10-CM

## 2014-12-09 LAB — D-DIMER, QUANTITATIVE (NOT AT ARMC): D DIMER QUANT: 0.51 ug{FEU}/mL — AB (ref 0.00–0.48)

## 2014-12-09 MED ORDER — ASPIRIN EC 325 MG PO TBEC: 325.0000 mg | DELAYED_RELEASE_TABLET | Freq: Every day | ORAL | Status: DC

## 2014-12-09 NOTE — Assessment & Plan Note (Signed)
Slightly elevated D dimer in setting of chronic R leg swelling DVT suspected  LE venous doppler has been scheduled. Patient should stop naproxen and start daily aspirin 325 mg while awaiting venous doppler

## 2014-12-09 NOTE — Progress Notes (Signed)
Patient ID: Brett KassJohn N Grant, male   DOB: [birth year], 49 y.o.   MRN: [id number]  Prior Approval for LE doppler is [id number]

## 2014-12-13 ENCOUNTER — Telehealth: Payer: Self-pay | Admitting: *Deleted

## 2014-12-13 NOTE — Telephone Encounter (Signed)
Doppler schedule at [facility] on May 19 at 10:00 Pt aware of appointment

## 2014-12-16 ENCOUNTER — Ambulatory Visit (HOSPITAL_COMMUNITY): Admission: RE | Admit: 2014-12-16 | Payer: Medicaid Other | Source: Ambulatory Visit

## 2014-12-22 ENCOUNTER — Telehealth: Payer: Self-pay | Admitting: *Deleted

## 2014-12-22 NOTE — Telephone Encounter (Signed)
-----   Message from Dessa PhiJosalyn Funches, MD sent at 12/09/2014  3:11 PM EDT ----- Slightly elevated D dimer in setting of chronic R leg swelling DVT suspected  LE venous doppler has been scheduled. Patient should stop naproxen and start daily aspirin 325 mg while awaiting venous doppler

## 2014-12-22 NOTE — Telephone Encounter (Signed)
-----   Message from Dessa PhiJosalyn Funches, MD sent at 12/09/2014  8:24 AM EDT ----- Normal CBC, normal WBC

## 2014-12-22 NOTE — Telephone Encounter (Signed)
Spoke with brother give lab results Pt missed doppler appointment  No reason given Called pt did not pick-up, unable to LVM Pt # [phone]

## 2014-12-30 ENCOUNTER — Ambulatory Visit: Payer: Medicaid Other

## 2015-03-06 IMAGING — DX DG ANKLE COMPLETE 3+V*R*
3 series · 3 of 3 positions shown · non-contrast
Comparison: None.

CLINICAL DATA: Right ankle swelling and soreness for 4 weeks. No
known injury.

EXAM:
RIGHT ANKLE - COMPLETE 3+ VIEW

[ankle ap]
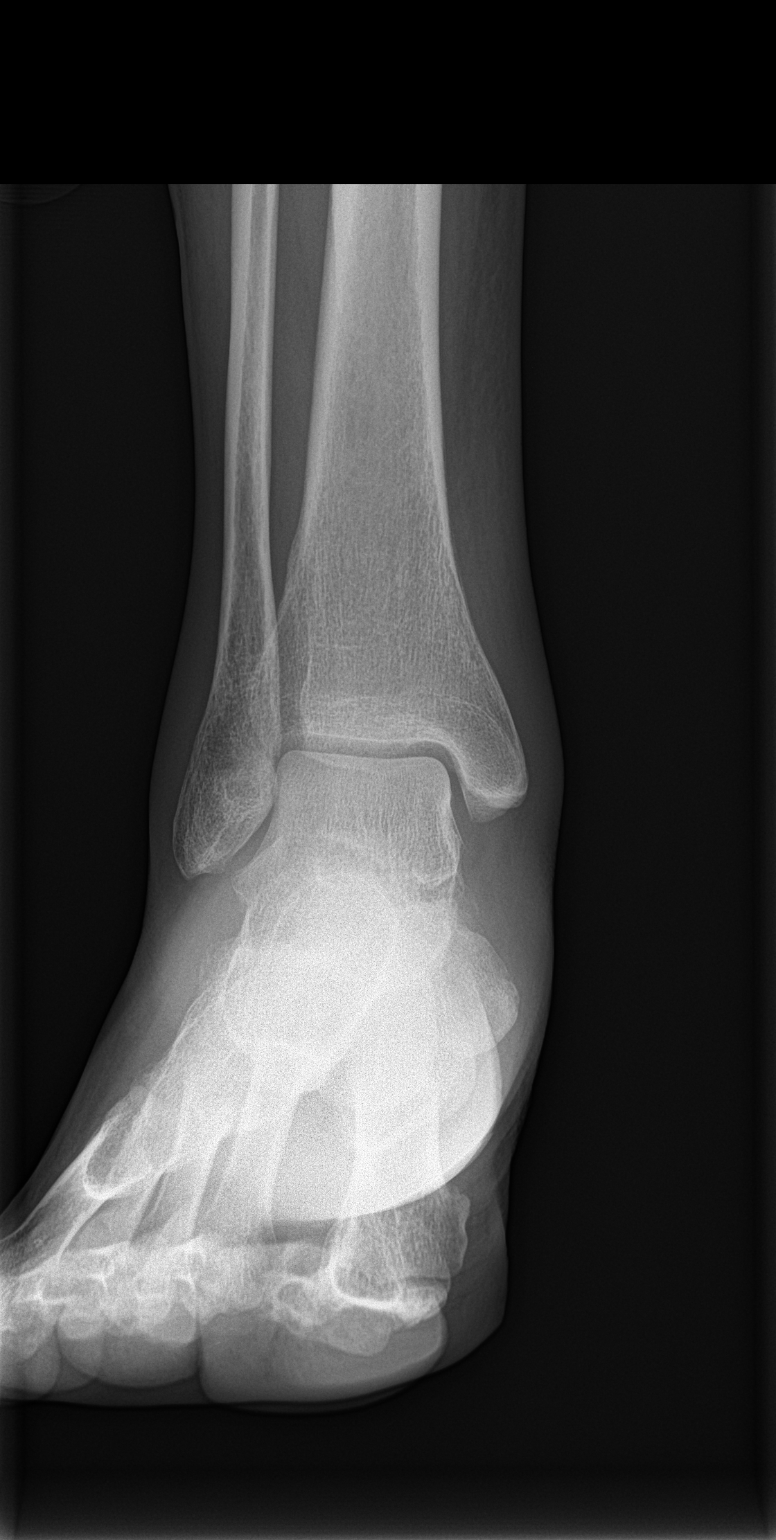

[ankle obl]
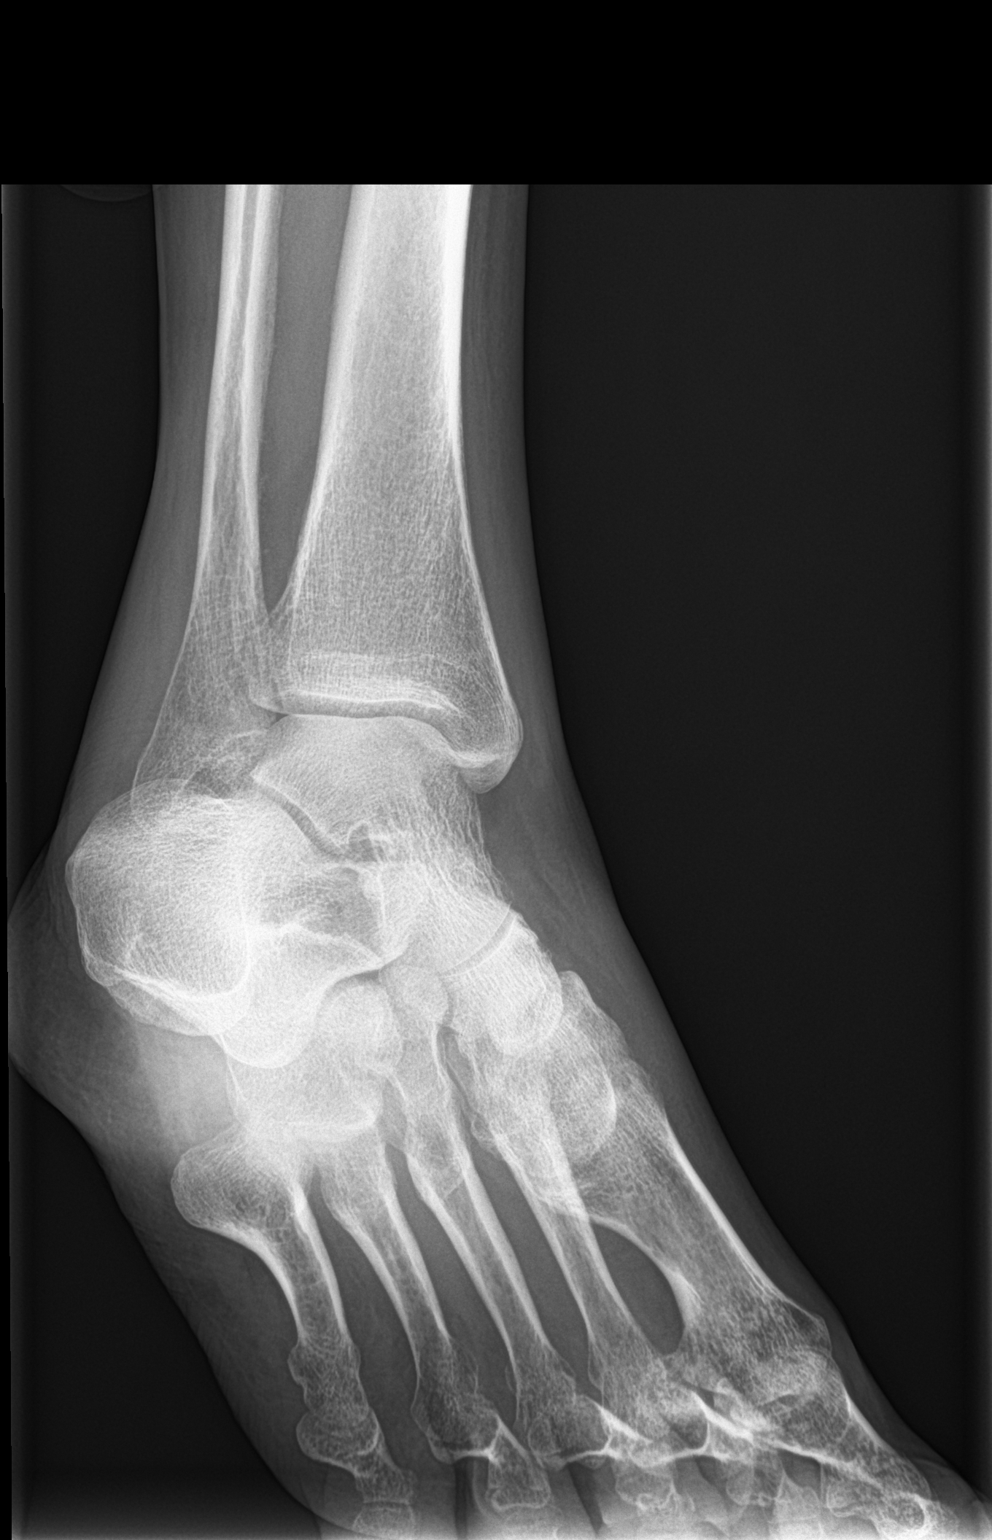

[ankle lat]
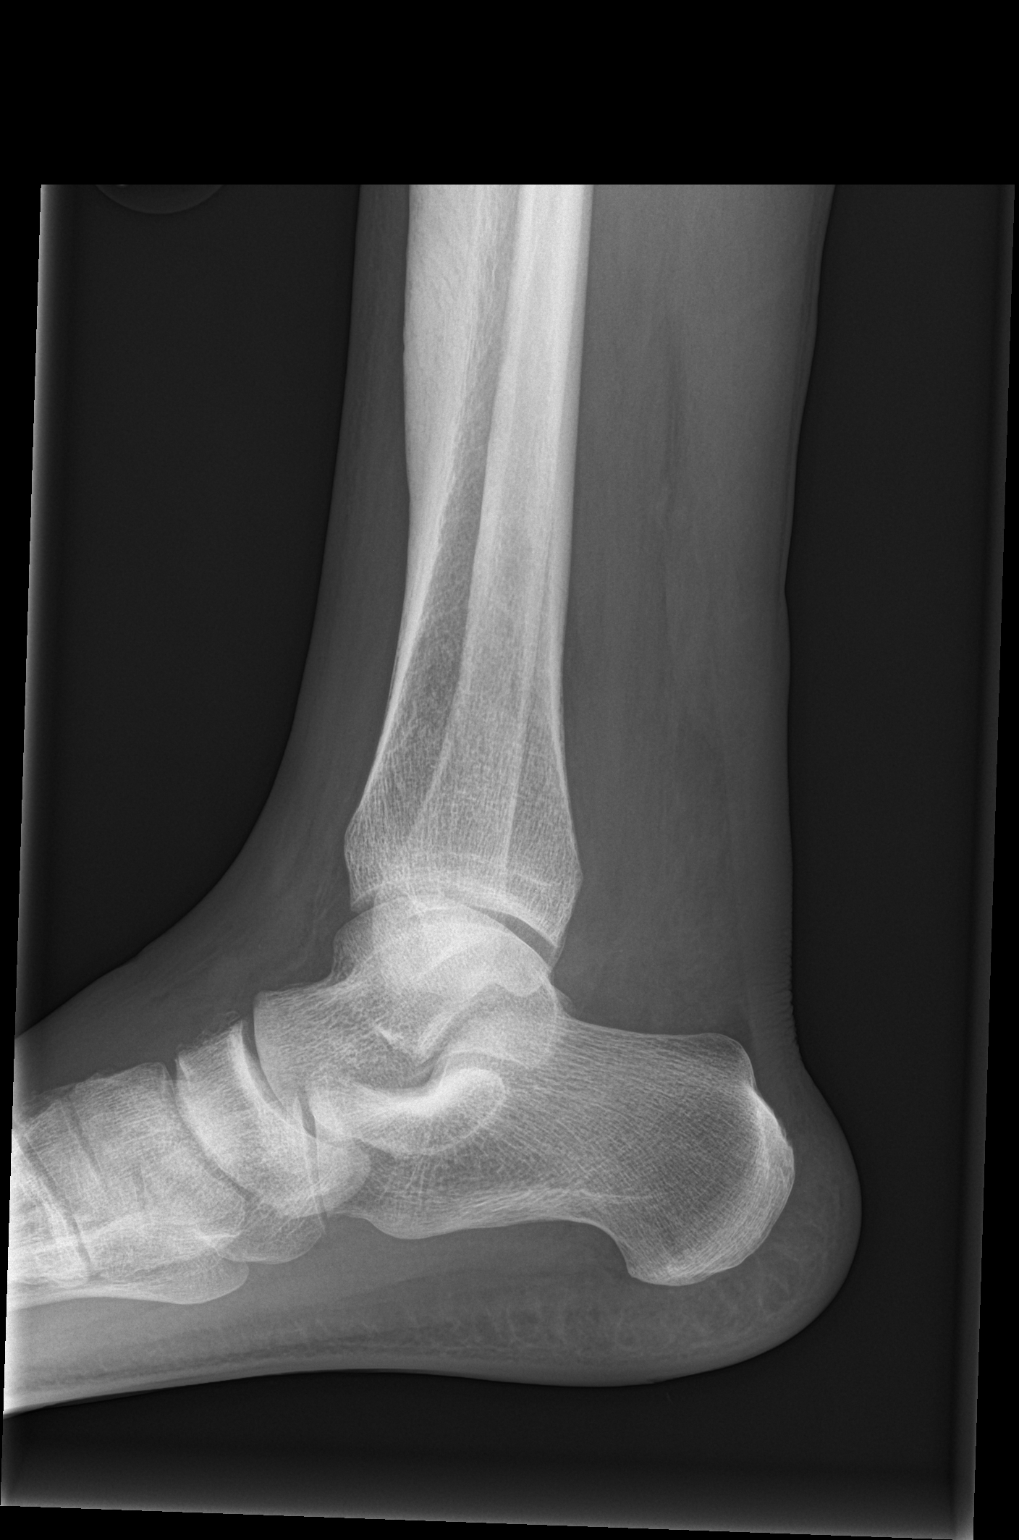

[3 of 3 positions shown; findings below may reference images not displayed]

FINDINGS: No acute bony or joint abnormality is identified. Soft tissues about
the ankle appear somewhat swollen. No radiopaque foreign body or
soft tissue gas collection is seen. Cortex of the distal diaphysis
of the tibia anteriorly appears thickened.
IMPRESSION: Mild appearing soft tissue about the ankle without underlying bony
or joint abnormality.

Partial visualization of cortical thickening in the anterior distal
tibia may be secondary to old stress change or fracture.
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
## 2024-02-16 ENCOUNTER — Other Ambulatory Visit: Payer: Self-pay

## 2024-02-16 ENCOUNTER — Ambulatory Visit (HOSPITAL_COMMUNITY): Admission: EM | Admit: 2024-02-16 | Discharge: 2024-02-16 | Disposition: A | Discharge status: Home / Self Care

## 2024-02-16 ENCOUNTER — Inpatient Hospital Stay (HOSPITAL_COMMUNITY)
Admission: EM | Admit: 2024-02-16 | Discharge: 2024-02-19 | DRG: 158 | Disposition: A | Discharge status: Home / Self Care | Source: Ambulatory Visit | Attending: Internal Medicine | Admitting: Internal Medicine

## 2024-02-16 ENCOUNTER — Inpatient Hospital Stay (HOSPITAL_COMMUNITY): Admitting: Anesthesiology

## 2024-02-16 ENCOUNTER — Encounter (HOSPITAL_COMMUNITY): Payer: Self-pay

## 2024-02-16 ENCOUNTER — Emergency Department (HOSPITAL_COMMUNITY)

## 2024-02-16 ENCOUNTER — Encounter (HOSPITAL_COMMUNITY)
Admission: EM | Disposition: A | Discharge status: Home / Self Care | Payer: Self-pay | Source: Ambulatory Visit | Attending: Family Medicine

## 2024-02-16 DIAGNOSIS — K029 Dental caries, unspecified: Secondary | ICD-10-CM | POA: Diagnosis present

## 2024-02-16 DIAGNOSIS — F10139 Alcohol abuse with withdrawal, unspecified: Secondary | ICD-10-CM | POA: Diagnosis not present

## 2024-02-16 DIAGNOSIS — R220 Localized swelling, mass and lump, head: Secondary | ICD-10-CM | POA: Diagnosis not present

## 2024-02-16 DIAGNOSIS — M272 Inflammatory conditions of jaws: Secondary | ICD-10-CM | POA: Diagnosis present

## 2024-02-16 DIAGNOSIS — Z7982 Long term (current) use of aspirin: Secondary | ICD-10-CM | POA: Diagnosis not present

## 2024-02-16 DIAGNOSIS — Z79899 Other long term (current) drug therapy: Secondary | ICD-10-CM | POA: Diagnosis not present

## 2024-02-16 DIAGNOSIS — I10 Essential (primary) hypertension: Secondary | ICD-10-CM | POA: Diagnosis not present

## 2024-02-16 DIAGNOSIS — L03211 Cellulitis of face: Secondary | ICD-10-CM | POA: Diagnosis not present

## 2024-02-16 DIAGNOSIS — L0201 Cutaneous abscess of face: Secondary | ICD-10-CM

## 2024-02-16 DIAGNOSIS — F17210 Nicotine dependence, cigarettes, uncomplicated: Secondary | ICD-10-CM | POA: Diagnosis present

## 2024-02-16 DIAGNOSIS — E876 Hypokalemia: Secondary | ICD-10-CM | POA: Diagnosis not present

## 2024-02-16 DIAGNOSIS — K047 Periapical abscess without sinus: Secondary | ICD-10-CM | POA: Diagnosis not present

## 2024-02-16 DIAGNOSIS — K011 Impacted teeth: Secondary | ICD-10-CM | POA: Diagnosis present

## 2024-02-16 DIAGNOSIS — M47812 Spondylosis without myelopathy or radiculopathy, cervical region: Secondary | ICD-10-CM | POA: Diagnosis not present

## 2024-02-16 DIAGNOSIS — L0390 Cellulitis, unspecified: Secondary | ICD-10-CM

## 2024-02-16 DIAGNOSIS — L0211 Cutaneous abscess of neck: Secondary | ICD-10-CM

## 2024-02-16 DIAGNOSIS — K122 Cellulitis and abscess of mouth: Secondary | ICD-10-CM | POA: Diagnosis present

## 2024-02-16 HISTORY — PX: INCISION AND DRAINAGE ABSCESS: SHX5864

## 2024-02-16 LAB — CBC WITH DIFFERENTIAL/PLATELET
Abs Immature Granulocytes: 0.08 K/uL — ABNORMAL HIGH (ref 0.00–0.07)
Basophils Absolute: 0 K/uL (ref 0.0–0.1)
Basophils Relative: 0 %
Eosinophils Absolute: 0 K/uL (ref 0.0–0.5)
Eosinophils Relative: 0 %
HCT: 41.2 % (ref 39.0–52.0)
Hemoglobin: 14.6 g/dL (ref 13.0–17.0)
Immature Granulocytes: 1 %
Lymphocytes Relative: 7 %
Lymphs Abs: 1 K/uL (ref 0.7–4.0)
MCH: 32.4 pg (ref 26.0–34.0)
MCHC: 35.4 g/dL (ref 30.0–36.0)
MCV: 91.4 fL (ref 80.0–100.0)
Monocytes Absolute: 1.2 K/uL — ABNORMAL HIGH (ref 0.1–1.0)
Monocytes Relative: 8 %
Neutro Abs: 12.2 K/uL — ABNORMAL HIGH (ref 1.7–7.7)
Neutrophils Relative %: 84 %
Platelets: 299 K/uL (ref 150–400)
RBC: 4.51 MIL/uL (ref 4.22–5.81)
RDW: 12.2 % (ref 11.5–15.5)
WBC: 14.5 K/uL — ABNORMAL HIGH (ref 4.0–10.5)
nRBC: 0 % (ref 0.0–0.2)

## 2024-02-16 LAB — COMPREHENSIVE METABOLIC PANEL WITH GFR
ALT: 26 U/L (ref 0–44)
AST: 35 U/L (ref 15–41)
Albumin: 4.1 g/dL (ref 3.5–5.0)
Alkaline Phosphatase: 50 U/L (ref 38–126)
Anion gap: 15 (ref 5–15)
BUN: 8 mg/dL (ref 6–20)
CO2: 22 mmol/L (ref 22–32)
Calcium: 9.7 mg/dL (ref 8.9–10.3)
Chloride: 93 mmol/L — ABNORMAL LOW (ref 98–111)
Creatinine, Ser: 0.76 mg/dL (ref 0.61–1.24)
GFR, Estimated: >60 mL/min (ref >60)
Glucose, Bld: 106 mg/dL — ABNORMAL HIGH (ref 70–99)
Potassium: 3.7 mmol/L (ref 3.5–5.1)
Sodium: 130 mmol/L — ABNORMAL LOW (ref 135–145)
Total Bilirubin: 1.8 mg/dL — ABNORMAL HIGH (ref 0.0–1.2)
Total Protein: 8.1 g/dL (ref 6.5–8.1)

## 2024-02-16 LAB — I-STAT CG4 LACTIC ACID, ED: Lactic Acid, Venous: 1.1 mmol/L (ref 0.5–1.9)

## 2024-02-16 LAB — HEMOGLOBIN A1C
Hgb A1c MFr Bld: 4.8 % (ref 4.8–5.6)
Mean Plasma Glucose: 91.06 mg/dL

## 2024-02-16 LAB — HIV ANTIBODY (ROUTINE TESTING W REFLEX): HIV Screen 4th Generation wRfx: NONREACTIVE

## 2024-02-16 SURGERY — INCISION AND DRAINAGE, ABSCESS
Anesthesia: General

## 2024-02-16 MED ORDER — KETAMINE HCL 50 MG/5ML IJ SOSY
PREFILLED_SYRINGE | INTRAMUSCULAR | Status: AC
  Filled 2024-02-16: qty 5

## 2024-02-16 MED ORDER — LIDOCAINE-EPINEPHRINE 1 %-1:100000 IJ SOLN
INTRAMUSCULAR | Status: DC | PRN
Start: 2024-02-16 — End: 2024-02-16
  Administered 2024-02-16: 20 mL

## 2024-02-16 MED ORDER — OXYCODONE HCL 5 MG/5ML PO SOLN: 5.0000 mg | Freq: Once | ORAL | Status: DC | PRN

## 2024-02-16 MED ORDER — HYDROMORPHONE HCL 1 MG/ML IJ SOLN: 0.2500 mg | INTRAMUSCULAR | Status: DC | PRN

## 2024-02-16 MED ORDER — FENTANYL CITRATE (PF) 250 MCG/5ML IJ SOLN
INTRAMUSCULAR | Status: AC
Start: 2024-02-16 — End: 2024-02-16
  Filled 2024-02-16: qty 5

## 2024-02-16 MED ORDER — ONDANSETRON HCL 4 MG/2ML IJ SOLN
4.0000 mg | Freq: Four times a day (QID) | INTRAMUSCULAR | Status: DC | PRN
Start: 2024-02-16 — End: 2024-02-19

## 2024-02-16 MED ORDER — ACETAMINOPHEN 650 MG RE SUPP: 650.0000 mg | Freq: Four times a day (QID) | RECTAL | Status: DC | PRN

## 2024-02-16 MED ORDER — HYDRALAZINE HCL 20 MG/ML IJ SOLN: 10.0000 mg | Freq: Four times a day (QID) | INTRAMUSCULAR | Status: DC | PRN

## 2024-02-16 MED ORDER — DROPERIDOL 2.5 MG/ML IJ SOLN: 0.6250 mg | Freq: Once | INTRAMUSCULAR | Status: DC | PRN

## 2024-02-16 MED ORDER — SODIUM CHLORIDE 0.9 % IV SOLN
3.0000 g | Freq: Four times a day (QID) | INTRAVENOUS | Status: DC
  Administered 2024-02-16 – 2024-02-19 (×11): 3 g via INTRAVENOUS
  Filled 2024-02-16 (×12): qty 8

## 2024-02-16 MED ORDER — KETAMINE HCL 10 MG/ML IJ SOLN
INTRAMUSCULAR | Status: DC | PRN
Start: 2024-02-16 — End: 2024-02-16
  Administered 2024-02-16: 20 mg via INTRAVENOUS

## 2024-02-16 MED ORDER — ONDANSETRON HCL 4 MG/2ML IJ SOLN
INTRAMUSCULAR | Status: DC | PRN
  Administered 2024-02-16: 4 mg via INTRAVENOUS

## 2024-02-16 MED ORDER — ROCURONIUM BROMIDE 10 MG/ML (PF) SYRINGE: PREFILLED_SYRINGE | INTRAVENOUS | Status: DC | PRN

## 2024-02-16 MED ORDER — FENTANYL CITRATE PF 50 MCG/ML IJ SOSY
50.0000 ug | PREFILLED_SYRINGE | Freq: Once | INTRAMUSCULAR | Status: AC
  Administered 2024-02-16: 50 ug via INTRAVENOUS
  Filled 2024-02-16: qty 1

## 2024-02-16 MED ORDER — 0.9 % SODIUM CHLORIDE (POUR BTL) OPTIME
TOPICAL | Status: DC | PRN
  Administered 2024-02-16: 1000 mL

## 2024-02-16 MED ORDER — SUCCINYLCHOLINE CHLORIDE 200 MG/10ML IV SOSY
PREFILLED_SYRINGE | INTRAVENOUS | Status: DC | PRN
  Administered 2024-02-16: 40 mg via INTRAVENOUS

## 2024-02-16 MED ORDER — DEXAMETHASONE SODIUM PHOSPHATE 10 MG/ML IJ SOLN
INTRAMUSCULAR | Status: DC | PRN
  Administered 2024-02-16: 4 mg via INTRAVENOUS

## 2024-02-16 MED ORDER — ORAL CARE MOUTH RINSE
15.0000 mL | Freq: Once | OROMUCOSAL | Status: AC
Start: 2024-02-16 — End: 2024-02-16

## 2024-02-16 MED ORDER — OXYCODONE HCL 5 MG PO TABS: 5.0000 mg | ORAL_TABLET | Freq: Once | ORAL | Status: DC | PRN

## 2024-02-16 MED ORDER — ACETAMINOPHEN 325 MG PO TABS: 650.0000 mg | ORAL_TABLET | Freq: Four times a day (QID) | ORAL | Status: DC | PRN

## 2024-02-16 MED ORDER — ONDANSETRON HCL 4 MG PO TABS: 4.0000 mg | ORAL_TABLET | Freq: Four times a day (QID) | ORAL | Status: DC | PRN

## 2024-02-16 MED ORDER — LIDOCAINE-EPINEPHRINE 1 %-1:100000 IJ SOLN
INTRAMUSCULAR | Status: AC
  Filled 2024-02-16: qty 1

## 2024-02-16 MED ORDER — MIDAZOLAM HCL 2 MG/2ML IJ SOLN
INTRAMUSCULAR | Status: DC | PRN
  Administered 2024-02-16 (×2): 1 mg via INTRAVENOUS

## 2024-02-16 MED ORDER — HYDROMORPHONE HCL 1 MG/ML IJ SOLN: 0.5000 mg | INTRAMUSCULAR | Status: DC | PRN

## 2024-02-16 MED ORDER — OXYCODONE HCL 5 MG PO TABS: 5.0000 mg | ORAL_TABLET | ORAL | Status: DC | PRN

## 2024-02-16 MED ORDER — CHLORHEXIDINE GLUCONATE 0.12 % MT SOLN
OROMUCOSAL | Status: AC
  Filled 2024-02-16: qty 15

## 2024-02-16 MED ORDER — IOHEXOL 350 MG/ML SOLN
75.0000 mL | Freq: Once | INTRAVENOUS | Status: AC | PRN
  Administered 2024-02-16: 75 mL via INTRAVENOUS

## 2024-02-16 MED ORDER — CHLORHEXIDINE GLUCONATE 0.12 % MT SOLN
15.0000 mL | Freq: Once | OROMUCOSAL | Status: AC
  Administered 2024-02-16: 15 mL via OROMUCOSAL

## 2024-02-16 MED ORDER — PROPOFOL 10 MG/ML IV BOLUS
INTRAVENOUS | Status: DC | PRN
  Administered 2024-02-16: 10 mg via INTRAVENOUS
  Administered 2024-02-16: 60 mg via INTRAVENOUS
  Administered 2024-02-16: 10 mg via INTRAVENOUS
  Administered 2024-02-16: 30 mg via INTRAVENOUS

## 2024-02-16 MED ORDER — SODIUM CHLORIDE 0.9 % IV BOLUS
1000.0000 mL | Freq: Once | INTRAVENOUS | Status: AC
  Administered 2024-02-16: 1000 mL via INTRAVENOUS

## 2024-02-16 MED ORDER — MIDAZOLAM HCL 2 MG/2ML IJ SOLN
INTRAMUSCULAR | Status: AC
  Filled 2024-02-16: qty 2

## 2024-02-16 MED ORDER — LACTATED RINGERS IV SOLN: INTRAVENOUS | Status: DC

## 2024-02-16 MED ORDER — DEXMEDETOMIDINE HCL IN NACL 80 MCG/20ML IV SOLN
INTRAVENOUS | Status: DC | PRN
  Administered 2024-02-16: 12 ug via INTRAVENOUS

## 2024-02-16 MED ORDER — FENTANYL CITRATE (PF) 250 MCG/5ML IJ SOLN
INTRAMUSCULAR | Status: DC | PRN
  Administered 2024-02-16 (×2): 25 ug via INTRAVENOUS
  Administered 2024-02-16 (×2): 50 ug via INTRAVENOUS

## 2024-02-16 SURGICAL SUPPLY — 28 items
BAG COUNTER SPONGE SURGICOUNT (BAG) ×1 IMPLANT
CLEANER TIP ELECTROSURG 2X2 (MISCELLANEOUS) ×1 IMPLANT
CNTNR URN SCR LID CUP LEK RST (MISCELLANEOUS) ×1 IMPLANT
COVER SURGICAL LIGHT HANDLE (MISCELLANEOUS) ×1 IMPLANT
ELECT COATED BLADE 2.86 ST (ELECTRODE) ×1 IMPLANT
ELECTRODE REM PT RTRN 9FT ADLT (ELECTROSURGICAL) ×1 IMPLANT
GAUZE SPONGE 4X4 12PLY STRL LF (GAUZE/BANDAGES/DRESSINGS) IMPLANT
GLOVE BIO SURGEON STRL SZ7.5 (GLOVE) ×1 IMPLANT
GLOVE BIOGEL PI IND STRL 8 (GLOVE) ×1 IMPLANT
GOWN STRL REUS W/ TWL LRG LVL3 (GOWN DISPOSABLE) ×2 IMPLANT
GOWN STRL REUS W/ TWL XL LVL3 (GOWN DISPOSABLE) ×1 IMPLANT
KIT BASIN OR (CUSTOM PROCEDURE TRAY) ×1 IMPLANT
KIT TURNOVER KIT B (KITS) ×1 IMPLANT
MARKER SKIN DUAL TIP RULER LAB (MISCELLANEOUS) ×1 IMPLANT
NDL PRECISIONGLIDE 27X1.5 (NEEDLE) ×1 IMPLANT
NEEDLE PRECISIONGLIDE 27X1.5 (NEEDLE) ×1 IMPLANT
NS IRRIG 1000ML POUR BTL (IV SOLUTION) ×1 IMPLANT
PAD ARMBOARD POSITIONER FOAM (MISCELLANEOUS) ×2 IMPLANT
PENCIL SMOKE EVACUATOR (MISCELLANEOUS) ×1 IMPLANT
SUT CHROMIC 3 0 SH 27 (SUTURE) IMPLANT
SUT CHROMIC 4 0 P 3 18 (SUTURE) ×1 IMPLANT
SUT NYLON ETHILON 5-0 P-3 1X18 (SUTURE) ×1 IMPLANT
SUT SILK 3 0 REEL (SUTURE) ×1 IMPLANT
SUT SILK 4-0 18XBRD TIE 12 (SUTURE) ×1 IMPLANT
SYRINGE TOOMEY IRRIG 70ML (MISCELLANEOUS) ×1 IMPLANT
TOWEL GREEN STERILE FF (TOWEL DISPOSABLE) ×1 IMPLANT
TRAY ENT MC OR (CUSTOM PROCEDURE TRAY) ×1 IMPLANT
WATER STERILE IRR 1000ML POUR (IV SOLUTION) ×1 IMPLANT

## 2024-02-16 NOTE — ED Notes (Signed)
 Pt ambulated to the RR and back with family member

## 2024-02-16 NOTE — H&P (Signed)
 History and Physical    Brett Grant FMW:[id number] DOB: April 24, 1966 DOA: 02/16/2024  PCP: Pcp, No  Patient coming from: Home  I have personally briefly reviewed patient's old medical records in [facility]  Chief Complaint: Right-sided facial swelling since 1 week  HPI: Brett Grant is a 58 y.o. male withno significant past medical history presented with complaining of right-sided facial swelling since 1 week.  Reports that he initially noticed small swelling however that the started getting worse since last 2 to 3 days associated with pain.  He initially went to urgent care and was sent to ED for further evaluation.  He denies any injuries, insect bite.  Reports he cannot open his jaw due to severe swelling and pain.  No fever, chills, generalized weakness, lethargy.  Tells me he does not have any medical problems and does not take any medications at home.  He smoke Black and mild x 2/day, drinks 2 beers every day, denies illicit drug use.  No prior history of diabetes.  Does not see dentist for regular cleaning.  ED Course: Upon arrival to ED: Patient afebrile, pulse 110, RR 16, BP 169/125, maintaining oxygen saturation on room air..  CBC shows leukocytosis of 14.5, H&H 14.6/41.2, PLT 299.  NA 130, K: 3.7, chloride 93, BUN 8, creatinine 0.76.  Total bilirubin 1.8.  Lactic acid: WNL.  Blood culture pending.  CT soft tissue neck concerning for large abscess central along the posterior body, angle and ramus of the mandible on the right measuring 5.2 x 3.7 x 7.4 cm.  Abscess also extends inferiorly into the right submandibular space displacing the right mandibular gland this is likely arising from the right mandibular second molar tooth.  Surrounding inflammatory changes consistent with cellulitis.  EDP consulted oral surgeon.  Started on broad-spectrum antibiotics Unasyn .  [facility] hospitalist consulted for admission  Review of Systems: As per HPI otherwise negative.    Past Medical  History:  Diagnosis Date   Hypertension     History reviewed. No pertinent surgical history.   reports that he has been smoking cigarettes and cigars. He does not have any smokeless tobacco history on file. He reports that he does not drink alcohol and does not use drugs.  No Known Allergies  Family History  Problem Relation Age of Onset   Cancer Mother        throat   Gout Maternal Uncle     Prior to Admission medications   Medication Sig Start Date End Date Taking? Authorizing Provider  amLODipine  (NORVASC ) 10 MG tablet Take 1 tablet (10 mg total) by mouth daily. 11/17/14   Funches, Josalyn, MD  aspirin  EC 325 MG tablet Take 1 tablet (325 mg total) by mouth daily. 12/09/14   Funches, Josalyn, MD  carbamide peroxide (DEBROX) 6.5 % otic solution Place 5 drops into both ears 2 (two) times daily. For 5 days 12/08/14   Funches, Josalyn, MD  terbinafine  (LAMISIL  AT) 1 % cream Apply 1 application topically 2 (two) times daily. Between toes 11/17/14   Funches, Josalyn, MD    Physical Exam: Vitals:   02/16/24 1345 02/16/24 1400 02/16/24 1517 02/16/24 1523  BP: (!) 167/111 (!) 191/107 (!) 171/105 (!) 178/98  Pulse: 92 88 85 (!) 56  Resp:  17 18 17   Temp:   98.2 F (36.8 C) 98.4 F (36.9 C)  TempSrc:   Axillary Oral  SpO2: 100% 98% 100% 100%  Weight:      Height:  Constitutional: NAD, calm, comfortable, on room air, communicating well, brother at the bedside Eyes: PERRL, lids and conjunctivae normal ENMT: Poor dentition    Respiratory: clear to auscultation bilaterally, no wheezing, no crackles. Normal respiratory effort. No accessory muscle use.  Cardiovascular: Regular rate and rhythm, no murmurs / rubs / gallops. No extremity edema. 2+ pedal pulses. No carotid bruits.  Abdomen: no tenderness, no masses palpated. No hepatosplenomegaly. Bowel sounds positive.  Musculoskeletal: no clubbing / cyanosis. No joint deformity upper and lower extremities. Good ROM, no  contractures. Normal muscle tone.  Skin: no rashes, lesions, ulcers. No induration Neurologic: CN 2-12 grossly intact. Sensation intact, DTR normal. Strength 5/5 in all 4.  Psychiatric: Normal judgment and insight. Alert and oriented x 3. Normal mood.    Labs on Admission: I have personally reviewed following labs and imaging studies  CBC: Recent Labs  Lab 02/16/24 1133  WBC 14.5*  NEUTROABS 12.2*  HGB 14.6  HCT 41.2  MCV 91.4  PLT 299   Basic Metabolic Panel: Recent Labs  Lab 02/16/24 1133  NA 130*  K 3.7  CL 93*  CO2 22  GLUCOSE 106*  BUN 8  CREATININE 0.76  CALCIUM 9.7   GFR: Estimated Creatinine Clearance: 102.1 mL/min (by C-G formula based on SCr of 0.76 mg/dL). Liver Function Tests: Recent Labs  Lab 02/16/24 1133  AST 35  ALT 26  ALKPHOS 50  BILITOT 1.8*  PROT 8.1  ALBUMIN 4.1   No results for input(s): LIPASE, AMYLASE in the last 168 hours. No results for input(s): AMMONIA in the last 168 hours. Coagulation Profile: No results for input(s): INR, PROTIME in the last 168 hours. Cardiac Enzymes: No results for input(s): CKTOTAL, CKMB, CKMBINDEX, TROPONINI in the last 168 hours. BNP (last 3 results) No results for input(s): PROBNP in the last 8760 hours. HbA1C: No results for input(s): HGBA1C in the last 72 hours. CBG: No results for input(s): GLUCAP in the last 168 hours. Lipid Profile: No results for input(s): CHOL, HDL, LDLCALC, TRIG, CHOLHDL, LDLDIRECT in the last 72 hours. Thyroid Function Tests: No results for input(s): TSH, T4TOTAL, FREET4, T3FREE, THYROIDAB in the last 72 hours. Anemia Panel: No results for input(s): VITAMINB12, FOLATE, FERRITIN, TIBC, IRON, RETICCTPCT in the last 72 hours. Urine analysis: No results found for: COLORURINE, APPEARANCEUR, LABSPEC, PHURINE, GLUCOSEU, HGBUR, BILIRUBINUR, KETONESUR, PROTEINUR, UROBILINOGEN, NITRITE,  LEUKOCYTESUR  Radiological Exams on Admission: CT Soft Tissue Neck W Contrast Result Date: 02/16/2024 CLINICAL DATA:  Provided history: Soft tissue infection suspected, neck, no prior imaging. EXAM: CT NECK WITH CONTRAST TECHNIQUE: Multidetector CT imaging of the neck was performed using the standard protocol following the bolus administration of intravenous contrast. RADIATION DOSE REDUCTION: This exam was performed according to the departmental dose-optimization program which includes automated exposure control, adjustment of the mA and/or kV according to patient size and/or use of iterative reconstruction technique. CONTRAST:  75mL OMNIPAQUE  IOHEXOL  350 MG/ML SOLN COMPARISON:  None. FINDINGS: Pharynx and larynx: Large abscess centered along the posterior body, angle and ramus of the mandible on the right, measuring 5.2 x 3.7 x 7.4 cm. The abscess laterally displaces, and likely involves, the right masseter muscle. The abscess also extends inferiorly into the right submandibular space, posteriorly displacing the right submandibular gland. The abscess is likely arising from the right mandibular second molar tooth (which has prominent surrounding periapical lucency with adjacent inferior cortical deficiency). Surrounding edema/inflammatory changes within the perimandibular region more anteriorly, and within the upper neck, consistent with cellulitis. No significant  airway effacement. Salivary glands: The right parotid gland is laterally displaced by a large perimandibular abscess (described above). Unremarkable appearance of the left parotid and bilateral submandibular glands. Thyroid: Unremarkable. Lymph nodes: No pathologically enlarged lymph nodes identified within the neck. Vascular: The major vascular structures of the neck are patent. Minimal atherosclerotic plaque about the right carotid bifurcation. Limited intracranial: No evidence of an acute intracranial abnormality within the field of view.  Visualized orbits: No orbital mass or acute orbital finding. Mastoids and visualized paranasal sinuses: Minimal mucosal thickening within the right maxillary sinus. No significant mastoid effusion. Skeleton: Cervical spondylosis. Periapical lucency surrounding the right mandibular second molar tooth with adjacent inferior cortical deficiency (series 5, image 46). Upper chest: No consolidation within the imaged lung apices. IMPRESSION: 1. Large abscess centered along the posterior body, angle and ramus of the mandible on the right, measuring 5.2 x 3.7 x 7.4 cm. The abscess laterally displaces, and likely involves from a the right masseter muscle. The abscess also extends inferiorly into the right submandibular space, posteriorly displacing the right submandibular gland. This is likely arising from the right mandibular second molar tooth (which has prominent surrounding periapical lucency with adjacent inferior cortical deficiency). 2. Surrounding inflammatory changes within the perimandibular region more anteriorly, and within the upper neck, consistent with cellulitis. Electronically Signed   By: Rockey Childs D.O.   On: 02/16/2024 14:31    EKG: Independently reviewed.  Normal sinus rhythm.  No acute ST-T wave changes noted.  Assessment/Plan  Submandibular and submasseteric abscess: - Presented with significant right-sided facial swelling and pain since 1 week.  Afebrile with leukocytosis.  Lactic acid: WNL.  Blood culture pending.  Reviewed CT soft tissue. - Received Unasyn , morphine in the ED. - EDP consulted oral surgery recommended surgical drainage and removal of indicated tooth. - Will keep him NPO.  Check A1c.  Will continue Unasyn , IV fluids and as needed pain medications.  Elevated blood pressure: - Per  chart he has prior history of hypertension however patient reports that he does not take any medications at home. - Start hydralazine  as needed.  Monitor blood pressure closely.  Tobacco  abuse Alcohol use: Recommend cessation  DVT prophylaxis: SCD Code Status: Full code Family Communication: Patient's brother  present at bedside.  Plan of care discussed with patient in length and he verbalized understanding and agreed with it. Disposition Plan: Home Consults called: Oral surgeon Admission status: Inpatient   Velna JONELLE Skeeter MD [facility]  If 7PM-7AM, please contact night-coverage [email or web address]  02/16/2024, 3:31 PM

## 2024-02-16 NOTE — Transfer of Care (Signed)
 Immediate Anesthesia Transfer of Care Note  Patient: [name]  Procedure(s) Performed: INCISION AND DRAINAGE, ABSCESS, Tooth Extraction  Patient Location: PACU  Anesthesia Type:General  Level of Consciousness: awake and alert   Airway & Oxygen Therapy: Patient Spontanous Breathing and Patient connected to nasal cannula oxygen  Post-op Assessment: Report given to RN and Post -op Vital signs reviewed and stable  Post vital signs: Reviewed and stable  Last Vitals:  Vitals Value Taken Time  BP 167/103 02/16/24 18:32  Temp 37.4 C 02/16/24 18:32  Pulse 86 02/16/24 18:41  Resp 15 02/16/24 18:41  SpO2 96 % 02/16/24 18:41  Vitals shown include unfiled device data.  Last Pain:  Vitals:   02/16/24 1832  TempSrc:   PainSc: 0-No pain         Complications: No notable events documented.

## 2024-02-16 NOTE — ED Provider Notes (Signed)
 [facility] [place]  Note:  This document was prepared using Dragon voice recognition software and may include unintentional dictation errors.  MRN: [id number] DOB: 1965-10-19  Subjective:   Brett Grant is a 58 y.o. male presenting for right-sided facial swelling x 1 week.  Patient has developed unrelated and states that he has only had symptoms for about a week however based on the size of the facial swelling and suspected abscess, potential infection has been going on for much longer than 1 week.  Patient has multiple dental caries and gingivitis noted on exam consistent with severe dental abscess.  Swelling is much more severe than expected for simple dental abscess.  Patient denies any severe pain, no fever, no difficulty breathing or swelling to the throat, no stridor, no wheezing.  Patient unable to open mouth fully to evaluate inside for dental abscess.  No current facility-administered medications for this encounter.  Current Outpatient Medications:    amLODipine  (NORVASC ) 10 MG tablet, Take 1 tablet (10 mg total) by mouth daily., Disp: 30 tablet, Rfl: 5   aspirin  EC 325 MG tablet, Take 1 tablet (325 mg total) by mouth daily., Disp: 30 tablet, Rfl: 0   carbamide peroxide (DEBROX) 6.5 % otic solution, Place 5 drops into both ears 2 (two) times daily. For 5 days, Disp: 15 mL, Rfl: 0   terbinafine  (LAMISIL  AT) 1 % cream, Apply 1 application topically 2 (two) times daily. Between toes, Disp: 30 g, Rfl: 2   No Known Allergies  Past Medical History:  Diagnosis Date   Hypertension      History reviewed. No pertinent surgical history.  Family History  Problem Relation Age of Onset   Cancer Mother        throat   Gout Maternal Uncle     Social History   Tobacco Use   Smoking status: Every Day    Current packs/day: 0.50    Types: Cigarettes, Cigars  Vaping Use   Vaping status: Never Used  Substance Use Topics   Alcohol use: No    Alcohol/week: 9.0 standard drinks  of alcohol    Types: 9 Cans of beer per week   Drug use: No    ROS Refer to HPI for ROS details.  Objective:   Vitals: BP (!) 157/103 (BP Location: Right Arm)   Pulse (!) 116   Temp 98.9 F (37.2 C) (Oral)   Resp 18   Ht 5' 11 (1.803 m)   SpO2 96%   BMI 21.34 kg/m   Physical Exam Vitals and nursing note reviewed.  Constitutional:      General: He is not in acute distress.    Appearance: Normal appearance. He is well-developed. He is not ill-appearing or toxic-appearing.  HENT:     Head: Normocephalic.     Mouth/Throat:     Mouth: Mucous membranes are moist. No angioedema.     Dentition: Abnormal dentition. Dental tenderness, gingival swelling, dental caries and dental abscesses present. No gum lesions.     Tongue: No lesions.     Palate: Mass present.  Cardiovascular:     Rate and Rhythm: Normal rate.  Pulmonary:     Effort: Pulmonary effort is normal. No respiratory distress.  Skin:    General: Skin is warm and dry.  Neurological:     General: No focal deficit present.     Mental Status: He is alert and oriented to person, place, and time.  Psychiatric:        Mood  and Affect: Mood normal.        Behavior: Behavior normal.     Procedures  No results found for this or any previous visit (from the past 24 hours).  No results found.   Assessment and Plan :     Discharge Instructions       1. Facial cellulitis (Primary) - Due to severity of facial swelling and advanced imaging being unavailable in urgent care, recommend follow-up in ER at [facility] for soft tissue CT of the face as well as stat laboratory testing and possible IV antibiotics to treat severe facial cellulitis and abscess. - Go straight to emergency department after leaving urgent care for further evaluation and management. - Proper evaluation and management of severe facial abscess is beyond the means of urgent care for treatment.      Anaiza Behrens B Siler Mavis   Dorothy [place], [place] B,  [place] 02/16/24 1043

## 2024-02-16 NOTE — ED Provider Notes (Signed)
 [place] EMERGENCY DEPARTMENT AT [facility] Provider Note   CSN: [id number] Arrival date & time: 02/16/24  1053     Patient presents with: Facial Swelling   MERIL DRAY is a 58 y.o. male.   The history is provided by the patient and medical records. No language interpreter was used.  Dental Pain Location:  Generalized Quality:  Aching Severity:  Moderate Onset quality:  Gradual Duration:  3 days Timing:  Constant Progression:  Worsening Chronicity:  New Context: abscess   Context: not trauma   Relieved by:  Nothing Worsened by:  Jaw movement and pressure Ineffective treatments:  None tried Associated symptoms: facial pain, facial swelling, neck pain and trismus   Associated symptoms: no congestion, no fever and no headaches        Prior to Admission medications   Medication Sig Start Date End Date Taking? Authorizing Provider  amLODipine  (NORVASC ) 10 MG tablet Take 1 tablet (10 mg total) by mouth daily. 11/17/14   Funches, Josalyn, MD  aspirin  EC 325 MG tablet Take 1 tablet (325 mg total) by mouth daily. 12/09/14   Funches, Josalyn, MD  carbamide peroxide (DEBROX) 6.5 % otic solution Place 5 drops into both ears 2 (two) times daily. For 5 days 12/08/14   Funches, Josalyn, MD  terbinafine  (LAMISIL  AT) 1 % cream Apply 1 application topically 2 (two) times daily. Between toes 11/17/14   Funches, Josalyn, MD    Allergies: Patient has no known allergies.    Review of Systems  Constitutional:  Negative for chills, diaphoresis, fatigue and fever.  HENT:  Positive for dental problem and facial swelling. Negative for congestion, sore throat, tinnitus and trouble swallowing.   Respiratory:  Negative for cough and chest tightness.   Cardiovascular:  Negative for chest pain.  Gastrointestinal:  Negative for abdominal pain, constipation, diarrhea, nausea and vomiting.  Genitourinary:  Negative for dysuria.  Musculoskeletal:  Positive for neck pain. Negative for  back pain.  Neurological:  Negative for headaches.  Psychiatric/Behavioral:  Negative for agitation.   All other systems reviewed and are negative.   Updated Vital Signs BP (!) 169/125 (BP Location: Right Arm)   Pulse (!) 110   Temp 97.9 F (36.6 C)   Resp 16   Ht 5' 11 (1.803 m)   Wt 70 kg   SpO2 100%   BMI 21.52 kg/m   Physical Exam Vitals and nursing note reviewed.  Constitutional:      General: He is not in acute distress.    Appearance: He is well-developed. He is not ill-appearing, toxic-appearing or diaphoretic.  HENT:     Head: Atraumatic.     Nose: No congestion or rhinorrhea.  Eyes:     Extraocular Movements: Extraocular movements intact.     Conjunctiva/sclera: Conjunctivae normal.     Pupils: Pupils are equal, round, and reactive to light.  Cardiovascular:     Rate and Rhythm: Normal rate and regular rhythm.     Heart sounds: No murmur heard. Pulmonary:     Effort: Pulmonary effort is normal. No respiratory distress.     Breath sounds: Normal breath sounds.  Abdominal:     Palpations: Abdomen is soft.     Tenderness: There is no abdominal tenderness.  Musculoskeletal:        General: No swelling.     Cervical back: Neck supple.  Skin:    General: Skin is warm and dry.     Capillary Refill: Capillary refill takes less than  2 seconds.  Neurological:     Mental Status: He is alert.  Psychiatric:        Mood and Affect: Mood normal.         (all labs ordered are listed, but only abnormal results are displayed) Labs Reviewed  COMPREHENSIVE METABOLIC PANEL WITH GFR - Abnormal; Notable for the following components:      Result Value   Sodium 130 (*)    Chloride 93 (*)    Glucose, Bld 106 (*)    Total Bilirubin 1.8 (*)    All other components within normal limits  CBC WITH DIFFERENTIAL/PLATELET - Abnormal; Notable for the following components:   WBC 14.5 (*)    Neutro Abs 12.2 (*)    Monocytes Absolute 1.2 (*)    Abs Immature Granulocytes 0.08  (*)    All other components within normal limits  CULTURE, BLOOD (ROUTINE X 2)  CULTURE, BLOOD (ROUTINE X 2)  AEROBIC/ANAEROBIC CULTURE W GRAM STAIN (SURGICAL/DEEP WOUND)  HEMOGLOBIN A1C  HIV ANTIBODY (ROUTINE TESTING W REFLEX)  CBC  COMPREHENSIVE METABOLIC PANEL WITH GFR  I-STAT CG4 LACTIC ACID, ED  I-STAT CG4 LACTIC ACID, ED    EKG: EKG Interpretation Date/Time:  Sunday February 16 2024 12:34:27 EDT Ventricular Rate:  99 PR Interval:  162 QRS Duration:  92 QT Interval:  338 QTC Calculation: 433 R Axis:   91  Text Interpretation: Normal sinus rhythm Biatrial enlargement Rightward axis Pulmonary disease pattern Incomplete right bundle branch block Abnormal ECG No previous ECGs available when compared to prior, faster rate No STEMI Confirmed by Ginger Barefoot ([id number]) on 02/16/2024 2:27:15 PM  Radiology: CT Soft Tissue Neck W Contrast Result Date: 02/16/2024 CLINICAL DATA:  Provided history: Soft tissue infection suspected, neck, no prior imaging. EXAM: CT NECK WITH CONTRAST TECHNIQUE: Multidetector CT imaging of the neck was performed using the standard protocol following the bolus administration of intravenous contrast. RADIATION DOSE REDUCTION: This exam was performed according to the departmental dose-optimization program which includes automated exposure control, adjustment of the mA and/or kV according to patient size and/or use of iterative reconstruction technique. CONTRAST:  75mL OMNIPAQUE  IOHEXOL  350 MG/ML SOLN COMPARISON:  None. FINDINGS: Pharynx and larynx: Large abscess centered along the posterior body, angle and ramus of the mandible on the right, measuring 5.2 x 3.7 x 7.4 cm. The abscess laterally displaces, and likely involves, the right masseter muscle. The abscess also extends inferiorly into the right submandibular space, posteriorly displacing the right submandibular gland. The abscess is likely arising from the right mandibular second molar tooth (which has prominent  surrounding periapical lucency with adjacent inferior cortical deficiency). Surrounding edema/inflammatory changes within the perimandibular region more anteriorly, and within the upper neck, consistent with cellulitis. No significant airway effacement. Salivary glands: The right parotid gland is laterally displaced by a large perimandibular abscess (described above). Unremarkable appearance of the left parotid and bilateral submandibular glands. Thyroid: Unremarkable. Lymph nodes: No pathologically enlarged lymph nodes identified within the neck. Vascular: The major vascular structures of the neck are patent. Minimal atherosclerotic plaque about the right carotid bifurcation. Limited intracranial: No evidence of an acute intracranial abnormality within the field of view. Visualized orbits: No orbital mass or acute orbital finding. Mastoids and visualized paranasal sinuses: Minimal mucosal thickening within the right maxillary sinus. No significant mastoid effusion. Skeleton: Cervical spondylosis. Periapical lucency surrounding the right mandibular second molar tooth with adjacent inferior cortical deficiency (series 5, image 46). Upper chest: No consolidation within the imaged lung apices. IMPRESSION:  1. Large abscess centered along the posterior body, angle and ramus of the mandible on the right, measuring 5.2 x 3.7 x 7.4 cm. The abscess laterally displaces, and likely involves from a the right masseter muscle. The abscess also extends inferiorly into the right submandibular space, posteriorly displacing the right submandibular gland. This is likely arising from the right mandibular second molar tooth (which has prominent surrounding periapical lucency with adjacent inferior cortical deficiency). 2. Surrounding inflammatory changes within the perimandibular region more anteriorly, and within the upper neck, consistent with cellulitis. Electronically Signed   By: Rockey Childs D.O.   On: 02/16/2024 14:31      Procedures   CRITICAL CARE Performed by: Lonni PARAS Jasim Harari Total critical care time: 35 minutes Critical care time was exclusive of separately billable procedures and treating other patients. Critical care was necessary to treat or prevent imminent or life-threatening deterioration. Critical care was time spent personally by me on the following activities: development of treatment plan with patient and/or surrogate as well as nursing, discussions with consultants, evaluation of patient's response to treatment, examination of patient, obtaining history from patient or surrogate, ordering and performing treatments and interventions, ordering and review of laboratory studies, ordering and review of radiographic studies, pulse oximetry and re-evaluation of patient's condition.   Medications Ordered in the ED  Ampicillin -Sulbactam (UNASYN ) 3 g in sodium chloride  0.9 % 100 mL IVPB (3 g Intravenous New Bag/Given 02/16/24 1407)  sodium chloride  0.9 % bolus 1,000 mL (1,000 mLs Intravenous New Bag/Given 02/16/24 1405)  fentaNYL  (SUBLIMAZE ) injection 50 mcg (50 mcg Intravenous Given 02/16/24 1402)  iohexol  (OMNIPAQUE ) 350 MG/ML injection 75 mL (75 mLs Intravenous Contrast Given 02/16/24 1404)                                    Medical Decision Making Amount and/or Complexity of Data Reviewed Labs: ordered. Radiology: ordered.  Risk Prescription drug management. Decision regarding hospitalization.    Gen[place] N Dasch is a 58 y.o. male with a past medical history significant for hypertension who presents for facial pain and swelling.  According to patient and family, for the last 2 or 3 days patient has developed swelling and pain to his right jaw and face.  He has never had this before.  He does have poor dentition.  He went to an urgent care and was sent here for evaluation and CT scan.  He denies any trauma to the face and denies any fevers but has had some chills.  He denies difficulty  swallowing or breathing but he cannot open his jaw.  He denies any chest pain shortness of breath or cough.  Denies any abdominal pain or back pain or flank pain.  Denies any vision changes or headache otherwise.  On exam, patient has impressive swelling to right face and right jaw.  He is tender under the tongue and under the jaw.  He has trismus and cannot open his jaw more than about a centimeter at best.  I had a difficult time seeing in the back of his throat so could not assess uvula well.  No stridor.  Lungs clear.  Chest nontender.  Pupils are symmetric and reactive with normal extraocular's.  See photo for all of the swelling.  Clinically concerned about large dental abscess or facial abscess.  With the swelling and pain going under the submandibular area, will get CT scan to rule out Ludwick's.  Will start antibiotics given the impressive nature of this and to get labs.  Anticipate admission for IV antibiotics and possible procedure with ENT based on imaging results.  Will give some pain medicine as well.  2:43 PM CT scan returned showing very large abscess up to 7 cm likely from a dental origin.  It is displacing the masseter muscle.  There is also evidence of surrounding cellulitis going on to the neck.  Will call oral surgery given the dental nature.  Anticipate admission.  2:49 PM Spoke to Dr. Helga with oral surgery who agrees that the patient likely needs a procedure to drain this today.  Patient will write n.p.o. and he recommended medicine admission.  Will call medicine for admit and he will come see the patient after he reviews the images.     Final diagnoses:  Facial swelling  Cellulitis, unspecified cellulitis site  Facial abscess  Dental abscess      Clinical Impression: 1. Facial swelling   2. Cellulitis, unspecified cellulitis site   3. Facial abscess   4. Dental abscess     Disposition: Admit  This note was prepared with assistance of Dragon voice  recognition software. Occasional wrong-word or sound-a-like substitutions may have occurred due to the inherent limitations of voice recognition software.      Hieu Herms, Lonni PARAS, MD 02/16/24 2019

## 2024-02-16 NOTE — Op Note (Signed)
 OPERATIVE REPORT  Patient: Brett Grant MRN: [id number] DOB: 11/23/2065 Date: 02/16/2024 Surgeon: Dr. Bettyann Horner Assistant: none  Pre-operative diagnosis: 1) Right submandibular abscess 2) Right submasseteric abscess 3) Dental Caries  Post-operative Diagnosis:  1) Right submandibular abscess 2) Right submasseteric abscess 3) Dental Caries  Indications for procedure: 58 year old male with 1 week history of pain and swelling of the right mandible presented to urgent care and was subsequently transferred to [facility] ED. Upon arrival, patient was found to be afebrile with a leukocytosis of 14. CT scan revealed a significant 5x7cm abscess of the right lower face associated with right mandibular molars. Patient was taken tot he OR for incision and drainage of the abscess and removal of infected teeth.   Description of procedure: Patient was brought to the operating room and placed supine on the operating room table. After anesthesia was successfully induced via oral tube, 1cc of 1% Lidocaine  with 1:100,000 epi was injected locally in the right neck overlying the abscess and more posteriorly at the angle of the mandible. The patient was prepped and draped for this type of oral surgical procedure.   A 1 cm incision was made in the skin overlying the point of maximal fluctuance and immediately purulence was encountered. Cultures obtained at this point. Blunt dissection with hemostats along the lateral border of the ramus into the submasseteric space produced about 20 - 30 cc of purulence. This site was opened and irrigated copiously with NS. A second incision was made though skin and blunt dissection with hemostats ensued to reach the inferior border of the mandible and the submandibular space was opened and an additional 15-20 cc of purulence was encountered. This site was also irrigated with NS. Penrose drains were then placed in both the submandibular and submasseteric spaces.   Attention then  turned intraorally where the oropharynx was suctioned and a throat pack was placed. 3cc 1% Lidocaine  with 1:100,000 epi injected as inferior alveolar nerve block. The patient was able to be opened enough to place a bite block and then a sulcular incision was made around teeth #31 and 32. These teeth were luxated and delivered. The sockets were curetted and irrigated. The throat pack was removed.   The patient was turned back over to anesthesia who successfully extubated the patient and transported him to PACU in stable condition. Post operative plan is for admission to medicine service for IV antibiotics, tailoring pending culture and sensitivities.

## 2024-02-16 NOTE — ED Triage Notes (Signed)
 Patient presenting with right side facial swelling onset 1 week ago. No known falls, injuries, or dental issues. States this has not happened before.  Prescriptions or OTC medications tried:   None

## 2024-02-16 NOTE — ED Notes (Signed)
 Blood culture number two drawn via 21 gauge butterfly from L forearm.

## 2024-02-16 NOTE — ED Notes (Signed)
 Patient is being discharged from the Urgent Care and sent to the Emergency Department via personal opperated vehicle . Per Ethel Aguas NP, patient is in need of higher level of care due to facial swelling with possible abscess. Patient is aware and verbalizes understanding of plan of care.  Vitals:   02/16/24 1024  BP: (!) 157/103  Pulse: (!) 116  Resp: 18  Temp: 98.9 F (37.2 C)  SpO2: 96%

## 2024-02-16 NOTE — Anesthesia Preprocedure Evaluation (Addendum)
 Anesthesia Evaluation  Patient identified by MRN, date of birth, ID band Patient awake    Reviewed: Allergy & Precautions, NPO status , Patient's Chart, lab work & pertinent test results  Airway Mallampati: II  TM Distance: >3 FB Neck ROM: Full  Mouth opening: Limited Mouth Opening  Dental  (+) Dental Advisory Given, Poor Dentition   Pulmonary Current Smoker   Pulmonary exam normal breath sounds clear to auscultation       Cardiovascular hypertension, Pt. on medications Normal cardiovascular exam Rhythm:Regular Rate:Normal     Neuro/Psych    GI/Hepatic negative GI ROS, Neg liver ROS,,,  Endo/Other  negative endocrine ROS    Renal/GU negative Renal ROS     Musculoskeletal negative musculoskeletal ROS (+)    Abdominal   Peds  Hematology negative hematology ROS (+)   Anesthesia Other Findings   Reproductive/Obstetrics                              Anesthesia Physical Anesthesia Plan  ASA: 2 and emergent  Anesthesia Plan: General   Post-op Pain Management: Ofirmev  IV (intra-op)* and Toradol IV (intra-op)*   Induction: Intravenous  PONV Risk Score and Plan: 3 and Ondansetron , Dexamethasone , Treatment may vary due to age or medical condition and Midazolam   Airway Management Planned: Oral ETT and Video Laryngoscope Planned  Additional Equipment:   Intra-op Plan:   Post-operative Plan: Extubation in OR  Informed Consent: I have reviewed the patients History and Physical, chart, labs and discussed the procedure including the risks, benefits and alternatives for the proposed anesthesia with the patient or authorized representative who has indicated his/her understanding and acceptance.     Dental advisory given  Plan Discussed with: CRNA  Anesthesia Plan Comments:          Anesthesia Quick Evaluation

## 2024-02-16 NOTE — Anesthesia Procedure Notes (Signed)
 Procedure Name: Intubation Date/Time: 02/16/2024 5:51 PM  Performed by: Jama Powell NOVAK, CRNAPre-anesthesia Checklist: Patient identified, Timeout performed, Emergency Drugs available, Suction available and Patient being monitored Patient Re-evaluated:Patient Re-evaluated prior to induction Oxygen Delivery Method: Circle system utilized Preoxygenation: Pre-oxygenation with 100% oxygen Induction Type: IV induction Ventilation: Mask ventilation without difficulty Laryngoscope Size: Glidescope and 3 Grade View: Grade II Tube type: Oral Tube size: 7.0 mm Number of attempts: 1 Airway Equipment and Method: Rigid stylet and Video-laryngoscopy Placement Confirmation: breath sounds checked- equal and bilateral, CO2 detector, positive ETCO2 and ETT inserted through vocal cords under direct vision Secured at: 23 cm Tube secured with: Tape Dental Injury: Teeth and Oropharynx as per pre-operative assessment  Difficulty Due To: Difficult Airway- due to limited oral opening

## 2024-02-16 NOTE — ED Notes (Signed)
 Pt in bed, pt reports a decrease in pain

## 2024-02-16 NOTE — ED Triage Notes (Signed)
 Pt c/o right sided facial swelling x 1 week. Pt states he has some soreness. Pt sent from UC for CT and IV antibiotics.

## 2024-02-16 NOTE — Consult Note (Signed)
 Reason for Consult:Neck abscess  Referring Physician: Dr. Tegeler  Brett Grant is an 58 y.o. male.  HPI: 58yo M presented to urgent care with right mandibular pain and swelling x 1 week. Significant trismus, but afebrile and denies any dyspnea. Was transferred to ED for management. WBC 14, continues to be afebrile. CT scan with significant right mandibular abscess related to lower right wisdom tooth. I was consulted for surgical management.   Past Medical History:  Diagnosis Date   Hypertension     History reviewed. No pertinent surgical history.  Family History  Problem Relation Age of Onset   Cancer Mother        throat   Gout Maternal Uncle     Social History:  reports that he has been smoking cigarettes and cigars. He does not have any smokeless tobacco history on file. He reports that he does not drink alcohol and does not use drugs.  Allergies: No Known Allergies  Medications: I have reviewed the patient's current medications.  Results for orders placed or performed during the hospital encounter of 02/16/24 (from the past 48 hours)  Comprehensive metabolic panel     Status: Abnormal   Collection Time: 02/16/24 11:33 AM  Result Value Ref Range   Sodium 130 (L) 135 - 145 mmol/L   Potassium 3.7 3.5 - 5.1 mmol/L   Chloride 93 (L) 98 - 111 mmol/L   CO2 22 22 - 32 mmol/L   Glucose, Bld 106 (H) 70 - 99 mg/dL    Comment: Glucose reference range applies only to samples taken after fasting for at least 8 hours.   BUN 8 6 - 20 mg/dL   Creatinine, Ser 9.23 0.61 - 1.24 mg/dL   Calcium 9.7 8.9 - 89.6 mg/dL   Total Protein 8.1 6.5 - 8.1 g/dL   Albumin 4.1 3.5 - 5.0 g/dL   AST 35 15 - 41 U/L   ALT 26 0 - 44 U/L   Alkaline Phosphatase 50 38 - 126 U/L   Total Bilirubin 1.8 (H) 0.0 - 1.2 mg/dL   GFR, Estimated >39 >39 mL/min    Comment: (NOTE) Calculated using the CKD-EPI Creatinine Equation (2021)    Anion gap 15 5 - 15    Comment: Performed at [facility], [address], [place], [place] [address]  CBC with Differential     Status: Abnormal   Collection Time: 02/16/24 11:33 AM  Result Value Ref Range   WBC 14.5 (H) 4.0 - 10.5 K/uL   RBC 4.51 4.22 - 5.81 MIL/uL   Hemoglobin 14.6 13.0 - 17.0 g/dL   HCT 58.7 60.9 - 47.9 %   MCV 91.4 80.0 - 100.0 fL   MCH 32.4 26.0 - 34.0 pg   MCHC 35.4 30.0 - 36.0 g/dL   RDW 87.7 88.4 - 84.4 %   Platelets 299 150 - 400 K/uL   nRBC 0.0 0.0 - 0.2 %   Neutrophils Relative % 84 %   Neutro Abs 12.2 (H) 1.7 - 7.7 K/uL   Lymphocytes Relative 7 %   Lymphs Abs 1.0 0.7 - 4.0 K/uL   Monocytes Relative 8 %   Monocytes Absolute 1.2 (H) 0.1 - 1.0 K/uL   Eosinophils Relative 0 %   Eosinophils Absolute 0.0 0.0 - 0.5 K/uL   Basophils Relative 0 %   Basophils Absolute 0.0 0.0 - 0.1 K/uL   Immature Granulocytes 1 %   Abs Immature Granulocytes 0.08 (H) 0.00 - 0.07 K/uL  Comment: Performed at [facility], [address] [place], [place] [address]  I-Stat Lactic Acid, ED     Status: None   Collection Time: 02/16/24 11:38 AM  Result Value Ref Range   Lactic Acid, Venous 1.1 0.5 - 1.9 mmol/L    CT Soft Tissue Neck W Contrast Result Date: 02/16/2024 CLINICAL DATA:  Provided history: Soft tissue infection suspected, neck, no prior imaging. EXAM: CT NECK WITH CONTRAST TECHNIQUE: Multidetector CT imaging of the neck was performed using the standard protocol following the bolus administration of intravenous contrast. RADIATION DOSE REDUCTION: This exam was performed according to the departmental dose-optimization program which includes automated exposure control, adjustment of the mA and/or kV according to patient size and/or use of iterative reconstruction technique. CONTRAST:  75mL OMNIPAQUE  IOHEXOL  350 MG/ML SOLN COMPARISON:  None. FINDINGS: Pharynx and larynx: Large abscess centered along the posterior body, angle and ramus of the mandible on the right, measuring 5.2 x 3.7 x 7.4 cm. The abscess laterally displaces, and likely  involves, the right masseter muscle. The abscess also extends inferiorly into the right submandibular space, posteriorly displacing the right submandibular gland. The abscess is likely arising from the right mandibular second molar tooth (which has prominent surrounding periapical lucency with adjacent inferior cortical deficiency). Surrounding edema/inflammatory changes within the perimandibular region more anteriorly, and within the upper neck, consistent with cellulitis. No significant airway effacement. Salivary glands: The right parotid gland is laterally displaced by a large perimandibular abscess (described above). Unremarkable appearance of the left parotid and bilateral submandibular glands. Thyroid: Unremarkable. Lymph nodes: No pathologically enlarged lymph nodes identified within the neck. Vascular: The major vascular structures of the neck are patent. Minimal atherosclerotic plaque about the right carotid bifurcation. Limited intracranial: No evidence of an acute intracranial abnormality within the field of view. Visualized orbits: No orbital mass or acute orbital finding. Mastoids and visualized paranasal sinuses: Minimal mucosal thickening within the right maxillary sinus. No significant mastoid effusion. Skeleton: Cervical spondylosis. Periapical lucency surrounding the right mandibular second molar tooth with adjacent inferior cortical deficiency (series 5, image 46). Upper chest: No consolidation within the imaged lung apices. IMPRESSION: 1. Large abscess centered along the posterior body, angle and ramus of the mandible on the right, measuring 5.2 x 3.7 x 7.4 cm. The abscess laterally displaces, and likely involves from a the right masseter muscle. The abscess also extends inferiorly into the right submandibular space, posteriorly displacing the right submandibular gland. This is likely arising from the right mandibular second molar tooth (which has prominent surrounding periapical lucency with  adjacent inferior cortical deficiency). 2. Surrounding inflammatory changes within the perimandibular region more anteriorly, and within the upper neck, consistent with cellulitis. Electronically Signed   By: Rockey Childs D.O.   On: 02/16/2024 14:31    Review of Systems Blood pressure (!) 191/107, pulse 88, temperature 97.9 F (36.6 C), resp. rate 17, height 5' 11 (1.803 m), weight 70 kg, SpO2 98%. Physical Exam Maxillofacial exam: significant right lower facial 1/3 edema, firm, warm, approaching spontaneous drainage through the skin at the angle of the mandible. Angle of the mandible blunted MIO ~45mm, significant calculus and poor oral hygiene Oral exam limited 2/2 trismus  Assessment/Plan: 58yo M with significant right submandibular and submasseteric abscess related to impacted tooth #32 requiring surgical drainage and removal of the indicated tooth. Case discussed with Dr. Tegeler who will admit the patient to medicine service and we have planned surgical treatment in the operating room this afternoon. Will  likely require at least 2 night admission due to the severity of the infection. Patient seen pre-operatively and case discussed with patient and his brother (HCPOA). Will plan to keep on Unasyn  pending culture and sensitivities.   Bettyann SQUIBB Amesha Bailey 02/16/2024, 3:01 PM

## 2024-02-16 NOTE — Discharge Instructions (Signed)
  1. Facial cellulitis (Primary) - Due to severity of facial swelling and advanced imaging being unavailable in urgent care, recommend follow-up in ER at [facility] for soft tissue CT of the face as well as stat laboratory testing and possible IV antibiotics to treat severe facial cellulitis and abscess. - Go straight to emergency department after leaving urgent care for further evaluation and management. - Proper evaluation and management of severe facial abscess is beyond the means of urgent care for treatment.

## 2024-02-16 NOTE — ED Provider Triage Note (Signed)
 Emergency Medicine Provider Triage Evaluation Note  Brett Grant , a 58 y.o. male  was evaluated in triage.  Pt complains of right-sided facial swelling x 1 week.  Is inconsistent with reporting difficulty breathing and swallowing.  Review of Systems  Positive:  Negative:   Physical Exam  BP (!) 169/125 (BP Location: Right Arm)   Pulse (!) 110   Temp 97.9 F (36.6 C)   Resp 16   Ht 5' 11 (1.803 m)   Wt 70 kg   SpO2 100%   BMI 21.52 kg/m  Gen:   Awake, no distress   Resp:  Normal effort  MSK:   Moves extremities without difficulty  Other:  Significant right-sided facial swelling, submandibular induration with fluctuance, trismus noted, unable to visualize oropharynx, is able to protrude tongue, no stridor noted  Medical Decision Making  Medically screening exam initiated at 12:11 PM.  Appropriate orders placed.  Brett Grant was informed that the remainder of the evaluation will be completed by another provider, this initial triage assessment does not replace that evaluation, and the importance of remaining in the ED until their evaluation is complete.  Will begin infectious workup, CT imaging. Will need a room   Brett Grant 02/16/24 1215

## 2024-02-16 NOTE — Progress Notes (Signed)
 Patient arrived in the unit accompanied by transport via bed.

## 2024-02-17 ENCOUNTER — Encounter (HOSPITAL_COMMUNITY): Payer: Self-pay | Admitting: Oral Surgery

## 2024-02-17 DIAGNOSIS — M272 Inflammatory conditions of jaws: Secondary | ICD-10-CM | POA: Diagnosis not present

## 2024-02-17 LAB — CBC
HCT: 39.6 % (ref 39.0–52.0)
Hemoglobin: 13.6 g/dL (ref 13.0–17.0)
MCH: 31.7 pg (ref 26.0–34.0)
MCHC: 34.3 g/dL (ref 30.0–36.0)
MCV: 92.3 fL (ref 80.0–100.0)
Platelets: 322 K/uL (ref 150–400)
RBC: 4.29 MIL/uL (ref 4.22–5.81)
RDW: 12.4 % (ref 11.5–15.5)
WBC: 13.1 K/uL — ABNORMAL HIGH (ref 4.0–10.5)
nRBC: 0 % (ref 0.0–0.2)

## 2024-02-17 LAB — COMPREHENSIVE METABOLIC PANEL WITH GFR
ALT: 24 U/L (ref 0–44)
AST: 31 U/L (ref 15–41)
Albumin: 3.6 g/dL (ref 3.5–5.0)
Alkaline Phosphatase: 46 U/L (ref 38–126)
Anion gap: 15 (ref 5–15)
BUN: 5 mg/dL — ABNORMAL LOW (ref 6–20)
CO2: 26 mmol/L (ref 22–32)
Calcium: 9.5 mg/dL (ref 8.9–10.3)
Chloride: 97 mmol/L — ABNORMAL LOW (ref 98–111)
Creatinine, Ser: 0.59 mg/dL — ABNORMAL LOW (ref 0.61–1.24)
GFR, Estimated: >60 mL/min (ref >60)
Glucose, Bld: 115 mg/dL — ABNORMAL HIGH (ref 70–99)
Potassium: 3.6 mmol/L (ref 3.5–5.1)
Sodium: 138 mmol/L (ref 135–145)
Total Bilirubin: 0.8 mg/dL (ref 0.0–1.2)
Total Protein: 7.3 g/dL (ref 6.5–8.1)

## 2024-02-17 MED ORDER — ORAL CARE MOUTH RINSE: 15.0000 mL | OROMUCOSAL | Status: DC | PRN

## 2024-02-17 MED ORDER — CHLORDIAZEPOXIDE HCL 25 MG PO CAPS: 25.0000 mg | ORAL_CAPSULE | Freq: Every day | ORAL | Status: DC

## 2024-02-17 MED ORDER — HYDROXYZINE HCL 25 MG PO TABS: 25.0000 mg | ORAL_TABLET | Freq: Four times a day (QID) | ORAL | Status: DC | PRN

## 2024-02-17 MED ORDER — NICOTINE POLACRILEX 2 MG MT GUM
2.0000 mg | CHEWING_GUM | OROMUCOSAL | Status: DC | PRN
  Filled 2024-02-17: qty 1

## 2024-02-17 MED ORDER — CHLORDIAZEPOXIDE HCL 25 MG PO CAPS
25.0000 mg | ORAL_CAPSULE | Freq: Four times a day (QID) | ORAL | Status: DC | PRN
Start: 2024-02-17 — End: 2024-02-20

## 2024-02-17 MED ORDER — ADULT MULTIVITAMIN W/MINERALS CH
1.0000 | ORAL_TABLET | Freq: Every day | ORAL | Status: DC
  Administered 2024-02-17 – 2024-02-19 (×3): 1 via ORAL
  Filled 2024-02-17 (×3): qty 1

## 2024-02-17 MED ORDER — CHLORDIAZEPOXIDE HCL 25 MG PO CAPS
25.0000 mg | ORAL_CAPSULE | Freq: Three times a day (TID) | ORAL | Status: AC
  Administered 2024-02-18 – 2024-02-19 (×3): 25 mg via ORAL
  Filled 2024-02-17 (×3): qty 1

## 2024-02-17 MED ORDER — THIAMINE HCL 100 MG/ML IJ SOLN
100.0000 mg | Freq: Once | INTRAMUSCULAR | Status: AC
  Administered 2024-02-17: 100 mg via INTRAMUSCULAR
  Filled 2024-02-17: qty 2

## 2024-02-17 MED ORDER — LORAZEPAM 2 MG/ML IJ SOLN
0.5000 mg | Freq: Once | INTRAMUSCULAR | Status: AC
  Administered 2024-02-17: 0.5 mg via INTRAVENOUS
  Filled 2024-02-17: qty 1

## 2024-02-17 MED ORDER — CHLORDIAZEPOXIDE HCL 5 MG PO CAPS
5.0000 mg | ORAL_CAPSULE | Freq: Three times a day (TID) | ORAL | Status: DC | PRN
  Administered 2024-02-17: 5 mg via ORAL
  Filled 2024-02-17: qty 1

## 2024-02-17 MED ORDER — AMLODIPINE BESYLATE 10 MG PO TABS
10.0000 mg | ORAL_TABLET | Freq: Every day | ORAL | Status: DC
  Administered 2024-02-17 – 2024-02-19 (×3): 10 mg via ORAL
  Filled 2024-02-17 (×3): qty 1

## 2024-02-17 MED ORDER — NICOTINE 21 MG/24HR TD PT24
21.0000 mg | MEDICATED_PATCH | Freq: Every day | TRANSDERMAL | Status: DC
  Administered 2024-02-17 – 2024-02-19 (×3): 21 mg via TRANSDERMAL
  Filled 2024-02-17 (×3): qty 1

## 2024-02-17 MED ORDER — CHLORDIAZEPOXIDE HCL 25 MG PO CAPS: 25.0000 mg | ORAL_CAPSULE | ORAL | Status: DC

## 2024-02-17 MED ORDER — CHLORDIAZEPOXIDE HCL 25 MG PO CAPS
25.0000 mg | ORAL_CAPSULE | Freq: Four times a day (QID) | ORAL | Status: AC
  Administered 2024-02-17 – 2024-02-18 (×4): 25 mg via ORAL
  Filled 2024-02-17 (×4): qty 1

## 2024-02-17 NOTE — Plan of Care (Signed)
   Problem: Education: Goal: Knowledge of General Education information will improve Description Including pain rating scale, medication(s)/side effects and non-pharmacologic comfort measures Outcome: Progressing

## 2024-02-17 NOTE — Anesthesia Postprocedure Evaluation (Signed)
 Anesthesia Post Note  Patient: Brett Grant  Procedure(s) Performed: INCISION AND DRAINAGE, ABSCESS, Tooth Extraction     Patient location during evaluation: PACU Anesthesia Type: General Level of consciousness: sedated and patient cooperative Pain management: pain level controlled Vital Signs Assessment: post-procedure vital signs reviewed and stable Respiratory status: spontaneous breathing Cardiovascular status: stable Anesthetic complications: no   No notable events documented.  Last Vitals:  Vitals:   02/17/24 1800 02/17/24 1952  BP: 134/86 132/88  Pulse: 75 73  Resp:  18  Temp:  (!) 36.4 C  SpO2:  100%    Last Pain:  Vitals:   02/17/24 1952  TempSrc: Axillary  PainSc:                  Norleen Pope

## 2024-02-17 NOTE — Progress Notes (Signed)
 [facility] ROUNDING NOTE Brett Grant FMW:[id number]  DOB: 1966-05-05  DOA: 02/16/2024  PCP: Pcp, No  02/17/2024,10:52 AM  LOS: 1 day    Code Status: Full code     from: Home current Dispo: Likely home    58 year old black male known tobacco ethanol habituation (drinks a quart of liquor a day), HTN, Admitted from [facility] ED 7/20 wit right sided jaw pain swelling etc. and inability to open mouth CT soft tissue neck showing 5.2 X3.7X 7.4 cm abscess extending to right submandibular space displacing the right submandibular gland and arising likely from second molar tooth  Dr. Hel[facility] dental consulted started on Unasyn  Underwent incision and drainage with 30 cc purulence which was copiously irrigated and a Penrose drain was placed and kept in place  Plan  Submandibular abscess secondary to caries with extraction of teeth 32 and 31 Surgical management per dental-white count improved to some degree-will be reassessed in the morning  EtOH Start Librium  5 3 times daily if overt signs of withdrawal start protocol  Give nicotine  patch and nicotine  gum  HTN Not taking any home meds-start back amlodipine  10 mg which she has used in the past  Data Reviewed:  Sodium 138 potassium 3.6 BUN/creatinine 5/0.5 AST/ALT 31/24 WBC down to 13 HIV nonreactive Gram stain moderate positive gram-positive cocci moderate gram-negative rods   DVT prophylaxis: SCD  Status is: Inpatient Remains inpatient appropriate because:   Requires further follow-up from dental     Subjective: Awake coherent quite excitable-walking in the hallway several times asking to go off the unit to smoke Otherwise pain seems controlled he is able to talk in clear voice I did not examine inside of his mouth    Objective + exam Vitals:   02/16/24 2002 02/17/24 0035 02/17/24 0500 02/17/24 0838  BP: (!) 153/97 (!) 155/91 (!) 156/85 120/84  Pulse: 84 85  (!) 55  Resp: 16   18  Temp: 97.8 F (36.6 C)   98.3 F (36.8 C)   TempSrc: Axillary   Oral  SpO2: 98%   100%  Weight:      Height:       Filed Weights   02/16/24 1107  Weight: 70 kg     Examination: EOMI NCAT no focal deficit icterus no pallor large bulky dressing right side Chest is clear no wheezing rales rhonchi Abdomen soft no rebound no guarding no lower extremity edema     Scheduled Meds:  amLODipine   10 mg Oral Daily   nicotine   21 mg Transdermal Daily   Continuous Infusions:  ampicillin -sulbactam (UNASYN ) IV 3 g (02/17/24 0750)    Time 55  Colen Grimes, MD  [facility]

## 2024-02-17 NOTE — Hospital Course (Signed)
 IMPRESSION: 1. Large abscess centered along the posterior body, angle and ramus of the mandible on the right, measuring 5.2 x 3.7 x 7.4 cm. The abscess laterally displaces, and likely involves from a the right masseter muscle. The abscess also extends inferiorly into the right submandibular space, posteriorly displacing the right submandibular gland. This is likely arising from the right mandibular second molar tooth (which has prominent surrounding periapical lucency with adjacent inferior cortical deficiency). 2. Surrounding inflammatory changes within the perimandibular region more anteriorly, and within the upper neck, consistent with cellulitis.

## 2024-02-17 NOTE — Progress Notes (Signed)
 1 Day Post-Op   Subjective/Chief Complaint: 58yo M POD1 s/p incision and drainage of right submasseteric and submandibular abscess with extraction of teeth #31 and 32. Reports feeling better this morning, denies fever, dyspnea.    Objective: Vital signs in last 24 hours: Temp:  [97.8 F (36.6 C)-99.4 F (37.4 C)] 97.8 F (36.6 C) (07/20 2002) Pulse Rate:  [56-116] 85 (07/21 0035) Resp:  [12-18] 16 (07/20 2002) BP: (153-191)/(85-125) 156/85 (07/21 0500) SpO2:  [91 %-100 %] 98 % (07/20 2002) Weight:  [70 kg] 70 kg (07/20 1107) Last BM Date :  (PTO)  Intake/Output from previous day: 07/20 0701 - 07/21 0700 In: 1600.5 [I.V.:500; IV Piggyback:1100.5] Out: 25 [Blood:25] Intake/Output this shift: No intake/output data recorded.  Maxillofacial exam: Moderate to severe right lower facial 1/3 edema, firm Penrose drains intact x2, anterior drain with moderate serosanguinous output Posterior drain with minimal serosanguinous output MIO~35mm Extraction sites hemostatic   Lab Results:  Recent Labs    02/16/24 1133  WBC 14.5*  HGB 14.6  HCT 41.2  PLT 299   BMET Recent Labs    02/16/24 1133  NA 130*  K 3.7  CL 93*  CO2 22  GLUCOSE 106*  BUN 8  CREATININE 0.76  CALCIUM 9.7   PT/INR No results for input(s): LABPROT, INR in the last 72 hours. ABG No results for input(s): PHART, HCO3 in the last 72 hours.  Invalid input(s): PCO2, PO2  Studies/Results: DG Orthopantogram Result Date: 02/16/2024 CLINICAL DATA:  Face abscess. EXAM: ORTHOPANTOGRAM/PANORAMIC COMPARISON:  None Available. FINDINGS: No fracture or bone lesion. No periapical lucency to suggest a root abscess. Missing bilateral mandibular premolars. IMPRESSION: 1. No evidence of the root abscess.  No acute finding. Electronically Signed   By: Alm Parkins M.D.   On: 02/16/2024 16:24   CT Soft Tissue Neck W Contrast Result Date: 02/16/2024 CLINICAL DATA:  Provided history: Soft tissue infection  suspected, neck, no prior imaging. EXAM: CT NECK WITH CONTRAST TECHNIQUE: Multidetector CT imaging of the neck was performed using the standard protocol following the bolus administration of intravenous contrast. RADIATION DOSE REDUCTION: This exam was performed according to the departmental dose-optimization program which includes automated exposure control, adjustment of the mA and/or kV according to patient size and/or use of iterative reconstruction technique. CONTRAST:  75mL OMNIPAQUE  IOHEXOL  350 MG/ML SOLN COMPARISON:  None. FINDINGS: Pharynx and larynx: Large abscess centered along the posterior body, angle and ramus of the mandible on the right, measuring 5.2 x 3.7 x 7.4 cm. The abscess laterally displaces, and likely involves, the right masseter muscle. The abscess also extends inferiorly into the right submandibular space, posteriorly displacing the right submandibular gland. The abscess is likely arising from the right mandibular second molar tooth (which has prominent surrounding periapical lucency with adjacent inferior cortical deficiency). Surrounding edema/inflammatory changes within the perimandibular region more anteriorly, and within the upper neck, consistent with cellulitis. No significant airway effacement. Salivary glands: The right parotid gland is laterally displaced by a large perimandibular abscess (described above). Unremarkable appearance of the left parotid and bilateral submandibular glands. Thyroid: Unremarkable. Lymph nodes: No pathologically enlarged lymph nodes identified within the neck. Vascular: The major vascular structures of the neck are patent. Minimal atherosclerotic plaque about the right carotid bifurcation. Limited intracranial: No evidence of an acute intracranial abnormality within the field of view. Visualized orbits: No orbital mass or acute orbital finding. Mastoids and visualized paranasal sinuses: Minimal mucosal thickening within the right maxillary sinus. No  significant mastoid effusion.  Skeleton: Cervical spondylosis. Periapical lucency surrounding the right mandibular second molar tooth with adjacent inferior cortical deficiency (series 5, image 46). Upper chest: No consolidation within the imaged lung apices. IMPRESSION: 1. Large abscess centered along the posterior body, angle and ramus of the mandible on the right, measuring 5.2 x 3.7 x 7.4 cm. The abscess laterally displaces, and likely involves from a the right masseter muscle. The abscess also extends inferiorly into the right submandibular space, posteriorly displacing the right submandibular gland. This is likely arising from the right mandibular second molar tooth (which has prominent surrounding periapical lucency with adjacent inferior cortical deficiency). 2. Surrounding inflammatory changes within the perimandibular region more anteriorly, and within the upper neck, consistent with cellulitis. Electronically Signed   By: Rockey Childs D.O.   On: 02/16/2024 14:31    Anti-infectives: Anti-infectives (From admission, onward)    Start     Dose/Rate Route Frequency Ordered Stop   02/16/24 1400  Ampicillin -Sulbactam (UNASYN ) 3 g in sodium chloride  0.9 % 100 mL IVPB        3 g 200 mL/hr over 30 Minutes Intravenous Every 6 hours 02/16/24 1350         Assessment/Plan: s/p Procedure(s) with comments: INCISION AND DRAINAGE, ABSCESS, Tooth Extraction (N/A) - INCISION AND DRAINAGE NECK ABSCESS, POSSIBLE TOOTH EXTRACTION POD1 s/p incision and drainage of right submasseteric and submandibular abscess with extraction of teeth #31 and 32. Drains with moderate output. Cultures with GPC, GNR continue with Unasyn . Will keep drains in place. Will trend WBC once available. To stay overnight and reassess in the AM  LOS: 1 day    Brett Grant 02/17/2024

## 2024-02-18 DIAGNOSIS — M272 Inflammatory conditions of jaws: Secondary | ICD-10-CM | POA: Diagnosis not present

## 2024-02-18 LAB — CBC WITH DIFFERENTIAL/PLATELET
Abs Immature Granulocytes: 0.08 K/uL — ABNORMAL HIGH (ref 0.00–0.07)
Basophils Absolute: 0 K/uL (ref 0.0–0.1)
Basophils Relative: 0 %
Eosinophils Absolute: 0.1 K/uL (ref 0.0–0.5)
Eosinophils Relative: 1 %
HCT: 41.8 % (ref 39.0–52.0)
Hemoglobin: 14.1 g/dL (ref 13.0–17.0)
Immature Granulocytes: 1 %
Lymphocytes Relative: 19 %
Lymphs Abs: 1.7 K/uL (ref 0.7–4.0)
MCH: 31.4 pg (ref 26.0–34.0)
MCHC: 33.7 g/dL (ref 30.0–36.0)
MCV: 93.1 fL (ref 80.0–100.0)
Monocytes Absolute: 0.4 K/uL (ref 0.1–1.0)
Monocytes Relative: 5 %
Neutro Abs: 6.4 K/uL (ref 1.7–7.7)
Neutrophils Relative %: 74 %
Platelets: 348 K/uL (ref 150–400)
RBC: 4.49 MIL/uL (ref 4.22–5.81)
RDW: 12.1 % (ref 11.5–15.5)
WBC: 8.7 K/uL (ref 4.0–10.5)
nRBC: 0 % (ref 0.0–0.2)

## 2024-02-18 LAB — BASIC METABOLIC PANEL WITH GFR
Anion gap: 11 (ref 5–15)
BUN: 5 mg/dL — ABNORMAL LOW (ref 6–20)
CO2: 28 mmol/L (ref 22–32)
Calcium: 9.5 mg/dL (ref 8.9–10.3)
Chloride: 97 mmol/L — ABNORMAL LOW (ref 98–111)
Creatinine, Ser: 0.71 mg/dL (ref 0.61–1.24)
GFR, Estimated: >60 mL/min (ref >60)
Glucose, Bld: 82 mg/dL (ref 70–99)
Potassium: 3 mmol/L — ABNORMAL LOW (ref 3.5–5.1)
Sodium: 136 mmol/L (ref 135–145)

## 2024-02-18 MED ORDER — POTASSIUM CHLORIDE CRYS ER 20 MEQ PO TBCR
40.0000 meq | EXTENDED_RELEASE_TABLET | Freq: Every day | ORAL | Status: DC
  Administered 2024-02-18 – 2024-02-19 (×2): 40 meq via ORAL
  Filled 2024-02-18 (×2): qty 2

## 2024-02-18 NOTE — Plan of Care (Signed)

## 2024-02-18 NOTE — Progress Notes (Signed)
 [facility] ROUNDING NOTE Brett Grant FMW:[id number]  DOB: 03-13-1966  DOA: 02/16/2024  PCP: Pcp, No  02/18/2024,9:24 AM  LOS: 2 days    Code Status: Full code     from: Home current Dispo: Likely home    58 year old black male known tobacco ethanol habituation (drinks a quart of liquor a day), HTN, Admitted from [facility] ED 7/20 wit right sided jaw pain swelling etc. and inability to open mouth CT soft tissue neck showing 5.2 X3.7X 7.4 cm abscess extending to right submandibular space displacing the right submandibular gland and arising likely from second molar tooth  Dr. [facility] dental consulted started on Unasyn  Underwent incision and drainage with 30 cc purulence which was copiously irrigated and a Penrose drain was placed and kept in place  Plan  Submandibular abscess secondary to caries with extraction of teeth 32 and 31 Surgical management per dental-white count improved to some degree-dental prefers to watch patient overnight and remove Penrose drain so we will see what happens, patient is growingGram-positive cocci and gram-negative gram-positive cocci and gram-negative rods  EtOH habituation drinks about a quart a day Librium  no EtOH withdrawal protocol started Ranging less than 2-discontinue Librium  tomorrow if stable  Hypokalemia Replace K-Dur 40 check labs again in a.m.  Give nicotine  patch and nicotine  gum  HTN Not taking any home meds-started back amlodipine  10 mg-blood pressure improved  Data Reviewed:  Sodium 136 potassium 3.0 BUN/creatinine 5/0.7 White count down to 8.7 now from peak of 14.5 5 no cultures pending   DVT prophylaxis: SCD  Status is: Inpatient Remains inpatient appropriate because:   Requires further follow-up from dental     Subjective: Coherent pleasant looks well feels good although a little anxious still   Objective + exam Vitals:   02/18/24 0355 02/18/24 0600 02/18/24 0855 02/18/24 0902  BP: 125/87 119/78 (!) 151/98 (!) 151/98   Pulse: (!) 59 60 75   Resp: 17  16   Temp: (!) 97.4 F (36.3 C)  (!) 97.4 F (36.3 C)   TempSrc: Axillary  Oral   SpO2: 100%  100%   Weight:      Height:       Filed Weights   02/16/24 1107  Weight: 70 kg     Examination:  Swelling to right side of face markedly reduced Looks well Chest is clear S1-S2 no murmur Abdomen soft no rebound no guarding ROM     Scheduled Meds:  amLODipine   10 mg Oral Daily   chlordiazePOXIDE   25 mg Oral TID   Followed by   NOREEN ON 02/19/2024] chlordiazePOXIDE   25 mg Oral BH-qamhs   Followed by   NOREEN ON 02/20/2024] chlordiazePOXIDE   25 mg Oral Daily   multivitamin with minerals  1 tablet Oral Daily   nicotine   21 mg Transdermal Daily   potassium chloride   40 mEq Oral Daily   Continuous Infusions:  ampicillin -sulbactam (UNASYN ) IV 3 g (02/18/24 0219)    Time 25  Colen Grimes, MD  [facility]

## 2024-02-18 NOTE — Progress Notes (Signed)
 2 Days Post-Op   Subjective/Chief Complaint: 57yo M POD2 s/p incision and drainage of right submasseteric and submandibular abscess with extraction of teeth #31 and 32. Reports continued improvement. NAEON.  Objective: Vital signs in last 24 hours: Temp:  [97.4 F (36.3 C)-98.3 F (36.8 C)] 97.4 F (36.3 C) (07/22 0355) Pulse Rate:  [55-78] 60 (07/22 0600) Resp:  [17-18] 17 (07/22 0355) BP: (119-146)/(72-96) 119/78 (07/22 0600) SpO2:  [100 %] 100 % (07/22 0355) Last BM Date :  (PTO)  Intake/Output from previous day: 07/21 0701 - 07/22 0700 In: 2180.1 [P.O.:1580; IV Piggyback:600.1] Out: -  Intake/Output this shift: Total I/O In: 1560.1 [P.O.:960; IV Piggyback:600.1] Out: -   Maxillofacial exam: Moderate right lower facial 1/3 edema, firm, improved from yesterday Penrose drains intact x2, anterior drain with mild serosanguinous output Posterior drain with minimal serosanguinous output MIO~31mm Extraction sites hemostatic  Lab Results:  Recent Labs    02/16/24 1133 02/17/24 0656  WBC 14.5* 13.1*  HGB 14.6 13.6  HCT 41.2 39.6  PLT 299 322   BMET Recent Labs    02/16/24 1133 02/17/24 0656  NA 130* 138  K 3.7 3.6  CL 93* 97*  CO2 22 26  GLUCOSE 106* 115*  BUN 8 5*  CREATININE 0.76 0.59*  CALCIUM 9.7 9.5   PT/INR No results for input(s): LABPROT, INR in the last 72 hours. ABG No results for input(s): PHART, HCO3 in the last 72 hours.  Invalid input(s): PCO2, PO2  Studies/Results: DG Orthopantogram Result Date: 02/16/2024 CLINICAL DATA:  Face abscess. EXAM: ORTHOPANTOGRAM/PANORAMIC COMPARISON:  None Available. FINDINGS: No fracture or bone lesion. No periapical lucency to suggest a root abscess. Missing bilateral mandibular premolars. IMPRESSION: 1. No evidence of the root abscess.  No acute finding. Electronically Signed   By: Alm Parkins M.D.   On: 02/16/2024 16:24   CT Soft Tissue Neck W Contrast Result Date: 02/16/2024 CLINICAL DATA:   Provided history: Soft tissue infection suspected, neck, no prior imaging. EXAM: CT NECK WITH CONTRAST TECHNIQUE: Multidetector CT imaging of the neck was performed using the standard protocol following the bolus administration of intravenous contrast. RADIATION DOSE REDUCTION: This exam was performed according to the departmental dose-optimization program which includes automated exposure control, adjustment of the mA and/or kV according to patient size and/or use of iterative reconstruction technique. CONTRAST:  75mL OMNIPAQUE  IOHEXOL  350 MG/ML SOLN COMPARISON:  None. FINDINGS: Pharynx and larynx: Large abscess centered along the posterior body, angle and ramus of the mandible on the right, measuring 5.2 x 3.7 x 7.4 cm. The abscess laterally displaces, and likely involves, the right masseter muscle. The abscess also extends inferiorly into the right submandibular space, posteriorly displacing the right submandibular gland. The abscess is likely arising from the right mandibular second molar tooth (which has prominent surrounding periapical lucency with adjacent inferior cortical deficiency). Surrounding edema/inflammatory changes within the perimandibular region more anteriorly, and within the upper neck, consistent with cellulitis. No significant airway effacement. Salivary glands: The right parotid gland is laterally displaced by a large perimandibular abscess (described above). Unremarkable appearance of the left parotid and bilateral submandibular glands. Thyroid: Unremarkable. Lymph nodes: No pathologically enlarged lymph nodes identified within the neck. Vascular: The major vascular structures of the neck are patent. Minimal atherosclerotic plaque about the right carotid bifurcation. Limited intracranial: No evidence of an acute intracranial abnormality within the field of view. Visualized orbits: No orbital mass or acute orbital finding. Mastoids and visualized paranasal sinuses: Minimal mucosal thickening  within the  right maxillary sinus. No significant mastoid effusion. Skeleton: Cervical spondylosis. Periapical lucency surrounding the right mandibular second molar tooth with adjacent inferior cortical deficiency (series 5, image 46). Upper chest: No consolidation within the imaged lung apices. IMPRESSION: 1. Large abscess centered along the posterior body, angle and ramus of the mandible on the right, measuring 5.2 x 3.7 x 7.4 cm. The abscess laterally displaces, and likely involves from a the right masseter muscle. The abscess also extends inferiorly into the right submandibular space, posteriorly displacing the right submandibular gland. This is likely arising from the right mandibular second molar tooth (which has prominent surrounding periapical lucency with adjacent inferior cortical deficiency). 2. Surrounding inflammatory changes within the perimandibular region more anteriorly, and within the upper neck, consistent with cellulitis. Electronically Signed   By: Rockey Childs D.O.   On: 02/16/2024 14:31    Anti-infectives: Anti-infectives (From admission, onward)    Start     Dose/Rate Route Frequency Ordered Stop   02/16/24 1400  Ampicillin -Sulbactam (UNASYN ) 3 g in sodium chloride  0.9 % 100 mL IVPB        3 g 200 mL/hr over 30 Minutes Intravenous Every 6 hours 02/16/24 1350         Assessment/Plan: s/p Procedure(s) with comments: INCISION AND DRAINAGE, ABSCESS, Tooth Extraction (N/A) - INCISION AND DRAINAGE NECK ABSCESS, POSSIBLE TOOTH EXTRACTION POD2 s/p incision and drainage of right submasseteric and submandibular abscess with extraction of teeth #31 and 32. Drains with decreased output. Clinically improving and progressing well. Cultures with GPC, GNR continue with Unasyn . Will keep drains in place. WBC downtrending. To stay one more night, likely discharge tomorrow after drain removal.   LOS: 2 days    Bettyann SHAUNNA Horner 02/18/2024

## 2024-02-19 DIAGNOSIS — M272 Inflammatory conditions of jaws: Secondary | ICD-10-CM | POA: Diagnosis not present

## 2024-02-19 LAB — CBC WITH DIFFERENTIAL/PLATELET
Abs Immature Granulocytes: 0.14 K/uL — ABNORMAL HIGH (ref 0.00–0.07)
Basophils Absolute: 0 K/uL (ref 0.0–0.1)
Basophils Relative: 1 %
Eosinophils Absolute: 0.2 K/uL (ref 0.0–0.5)
Eosinophils Relative: 3 %
HCT: 36.4 % — ABNORMAL LOW (ref 39.0–52.0)
Hemoglobin: 12.3 g/dL — ABNORMAL LOW (ref 13.0–17.0)
Immature Granulocytes: 2 %
Lymphocytes Relative: 37 %
Lymphs Abs: 2.7 K/uL (ref 0.7–4.0)
MCH: 31.6 pg (ref 26.0–34.0)
MCHC: 33.8 g/dL (ref 30.0–36.0)
MCV: 93.6 fL (ref 80.0–100.0)
Monocytes Absolute: 0.4 K/uL (ref 0.1–1.0)
Monocytes Relative: 6 %
Neutro Abs: 3.8 K/uL (ref 1.7–7.7)
Neutrophils Relative %: 51 %
Platelets: 353 K/uL (ref 150–400)
RBC: 3.89 MIL/uL — ABNORMAL LOW (ref 4.22–5.81)
RDW: 12.4 % (ref 11.5–15.5)
WBC: 7.3 K/uL (ref 4.0–10.5)
nRBC: 0 % (ref 0.0–0.2)

## 2024-02-19 LAB — BASIC METABOLIC PANEL WITH GFR
Anion gap: 10 (ref 5–15)
BUN: <5 mg/dL — ABNORMAL LOW (ref 6–20)
CO2: 26 mmol/L (ref 22–32)
Calcium: 9.6 mg/dL (ref 8.9–10.3)
Chloride: 102 mmol/L (ref 98–111)
Creatinine, Ser: 0.61 mg/dL (ref 0.61–1.24)
GFR, Estimated: >60 mL/min (ref >60)
Glucose, Bld: 93 mg/dL (ref 70–99)
Potassium: 4.3 mmol/L (ref 3.5–5.1)
Sodium: 138 mmol/L (ref 135–145)

## 2024-02-19 MED ORDER — ACETAMINOPHEN 325 MG PO TABS: 650.0000 mg | ORAL_TABLET | Freq: Four times a day (QID) | ORAL | 0 refills | Status: AC | PRN

## 2024-02-19 MED ORDER — OXYCODONE HCL 5 MG PO TABS: 5.0000 mg | ORAL_TABLET | Freq: Four times a day (QID) | ORAL | 0 refills | Status: AC | PRN

## 2024-02-19 MED ORDER — AMLODIPINE BESYLATE 10 MG PO TABS
10.0000 mg | ORAL_TABLET | Freq: Every day | ORAL | 2 refills | Status: AC
Start: 2024-02-20 — End: ?

## 2024-02-19 MED ORDER — AMOXICILLIN-POT CLAVULANATE 875-125 MG PO TABS: 1.0000 | ORAL_TABLET | Freq: Two times a day (BID) | ORAL | 0 refills | Status: AC

## 2024-02-19 MED ORDER — ADULT MULTIVITAMIN W/MINERALS CH: 1.0000 | ORAL_TABLET | Freq: Every day | ORAL | 0 refills | Status: AC

## 2024-02-19 MED ORDER — AMOXICILLIN-POT CLAVULANATE 875-125 MG PO TABS: 1.0000 | ORAL_TABLET | Freq: Two times a day (BID) | ORAL | 0 refills | Status: DC

## 2024-02-19 MED ORDER — NICOTINE 21 MG/24HR TD PT24: 21.0000 mg | MEDICATED_PATCH | Freq: Every day | TRANSDERMAL | 0 refills | Status: AC

## 2024-02-19 NOTE — Plan of Care (Signed)
   Problem: Health Behavior/Discharge Planning: Goal: Ability to manage health-related needs will improve Outcome: Progressing   Problem: Clinical Measurements: Goal: Ability to maintain clinical measurements within normal limits will improve Outcome: Progressing   Problem: Activity: Goal: Risk for activity intolerance will decrease Outcome: Progressing   Problem: Nutrition: Goal: Adequate nutrition will be maintained Outcome: Progressing

## 2024-02-19 NOTE — Progress Notes (Signed)
 Brett Grant to be D/C'd  per MD order.  Discussed with the patient and [name] (brother) all questions fully answered.  VSS, Skin clean, dry and intact without evidence of skin break down, no evidence of skin tears noted.  IV catheter discontinued intact. Site without signs and symptoms of complications. Dressing and pressure applied.  An After Visit Summary was printed and given to the patient. Patient instructed to pick up prescriptions from preferred pharmacy. Pt's brother expressed that he doesn't want pt to have oxycodone . I informed him that he can inform the pharmacy that he doesn't want that particular prescription. Pt's brother said he will give pt tylenol  for pain as instructed per d'c orders.   D/c education completed with patient/family including follow up instructions, medication list, d/c activities limitations if indicated, with other d/c instructions as indicated by MD - patient able to verbalize understanding, all questions fully answered.   Patient instructed to return to ED, call 911, or call MD for any changes in condition.   Patient walked out independently with brother and refused WC or staff escort, and D/C home via private auto.

## 2024-02-19 NOTE — Discharge Summary (Signed)
 Physician Discharge Summary   Patient: Brett Grant MRN: [id number] DOB: 1965-09-29  Admit date:     02/16/2024  Discharge date: 02/19/24  Discharge Physician: Owen DELENA Lore   PCP: Pcp, No   Recommendations at discharge:   Needs to follow up with Oral Surgeon.  Needs Follow up with PCP for chronic medical problems.  Discharge Diagnoses: Principal Problem:   Mandibular abscess  Resolved Problems:   * No resolved hospital problems. *  Hospital Course: 58 year old with past medical history significant for tobacco and ethanol use, hypertension presents with right-sided jaw pain and swelling and inability to open his mouth.  CT soft tissue neck showed 5.2 x 3.7 x 7. 7.4 centimeter abscess extending to the right submandibular space displacing the right submandibular gland and arising likely from the second molar tooth.  Dr. Helga with oral surgeon consulted, patient was started on IV Unasyn .  Patient underwent incision and drainage and irrigated and Penrose drain placed on 7/20.  Assessment and Plan: 1-Submandibular abscess secondary to caries with extraction of teeth 32 and 31. Right submasseteric abscess  - Patient underwent incision and drainage and Penrose placed on 7/20. - Cultures growing; abundant streptococci, moderate Prevotella, beta-lactamase positive sensitivity pending He was discharged on Augmentin  for 7 more days Needs to follow-up with oral surgeon   Alcohol abuse and withdrawal: Underwent Librium  protocol.  CIWA scores 1 Stable for discharge  Tobacco use: Discharged on nicotine  patch Hypertension: Advised to continue taking blood pressure medication.  Discharged on Norvasc          Consultants: Transport planner.  Procedures performed: I and D and teeth extraction.  Disposition: Home Diet recommendation:  Discharge Diet Orders (From admission, onward)     Start     Ordered   02/19/24 0000  Diet - low sodium heart healthy        02/19/24 1327            Cardiac diet DISCHARGE MEDICATION: Allergies as of 02/19/2024   No Known Allergies      Medication List     TAKE these medications    acetaminophen  325 MG tablet Commonly known as: TYLENOL  Take 2 tablets (650 mg total) by mouth every 6 (six) hours as needed for mild pain (pain score 1-3) or fever (or Fever >/= 101).   amLODipine  10 MG tablet Commonly known as: NORVASC  Take 1 tablet (10 mg total) by mouth daily. Start taking on: February 20, 2024   amoxicillin -clavulanate 875-125 MG tablet Commonly known as: AUGMENTIN  Take 1 tablet by mouth 2 (two) times daily for 7 days.   multivitamin with minerals Tabs tablet Take 1 tablet by mouth daily. Start taking on: February 20, 2024   nicotine  21 mg/24hr patch Commonly known as: NICODERM CQ  - dosed in mg/24 hours Place 1 patch (21 mg total) onto the skin daily. Start taking on: February 20, 2024   oxyCODONE  5 MG immediate release tablet Commonly known as: Oxy IR/ROXICODONE  Take 1 tablet (5 mg total) by mouth every 6 (six) hours as needed for up to 3 days for moderate pain (pain score 4-6).        Follow-up Information     Helga Bettyann SQUIBB, DMD Follow up in 1 week(s).   Specialty: Oral Surgery Contact information: [address] [place] [address] [phone]                Discharge Exam: Filed Weights   02/16/24 1107  Weight: 70 kg  General' NAD  Condition at discharge: stable  The results of significant diagnostics from this hospitalization (including imaging, microbiology, ancillary and laboratory) are listed below for reference.   Imaging Studies: DG Orthopantogram Result Date: 02/16/2024 CLINICAL DATA:  Face abscess. EXAM: ORTHOPANTOGRAM/PANORAMIC COMPARISON:  None Available. FINDINGS: No fracture or bone lesion. No periapical lucency to suggest a root abscess. Missing bilateral mandibular premolars. IMPRESSION: 1. No evidence of the root abscess.  No acute finding. Electronically  Signed   By: Alm Parkins M.D.   On: 02/16/2024 16:24   CT Soft Tissue Neck W Contrast Result Date: 02/16/2024 CLINICAL DATA:  Provided history: Soft tissue infection suspected, neck, no prior imaging. EXAM: CT NECK WITH CONTRAST TECHNIQUE: Multidetector CT imaging of the neck was performed using the standard protocol following the bolus administration of intravenous contrast. RADIATION DOSE REDUCTION: This exam was performed according to the departmental dose-optimization program which includes automated exposure control, adjustment of the mA and/or kV according to patient size and/or use of iterative reconstruction technique. CONTRAST:  75mL OMNIPAQUE  IOHEXOL  350 MG/ML SOLN COMPARISON:  None. FINDINGS: Pharynx and larynx: Large abscess centered along the posterior body, angle and ramus of the mandible on the right, measuring 5.2 x 3.7 x 7.4 cm. The abscess laterally displaces, and likely involves, the right masseter muscle. The abscess also extends inferiorly into the right submandibular space, posteriorly displacing the right submandibular gland. The abscess is likely arising from the right mandibular second molar tooth (which has prominent surrounding periapical lucency with adjacent inferior cortical deficiency). Surrounding edema/inflammatory changes within the perimandibular region more anteriorly, and within the upper neck, consistent with cellulitis. No significant airway effacement. Salivary glands: The right parotid gland is laterally displaced by a large perimandibular abscess (described above). Unremarkable appearance of the left parotid and bilateral submandibular glands. Thyroid: Unremarkable. Lymph nodes: No pathologically enlarged lymph nodes identified within the neck. Vascular: The major vascular structures of the neck are patent. Minimal atherosclerotic plaque about the right carotid bifurcation. Limited intracranial: No evidence of an acute intracranial abnormality within the field of view.  Visualized orbits: No orbital mass or acute orbital finding. Mastoids and visualized paranasal sinuses: Minimal mucosal thickening within the right maxillary sinus. No significant mastoid effusion. Skeleton: Cervical spondylosis. Periapical lucency surrounding the right mandibular second molar tooth with adjacent inferior cortical deficiency (series 5, image 46). Upper chest: No consolidation within the imaged lung apices. IMPRESSION: 1. Large abscess centered along the posterior body, angle and ramus of the mandible on the right, measuring 5.2 x 3.7 x 7.4 cm. The abscess laterally displaces, and likely involves from a the right masseter muscle. The abscess also extends inferiorly into the right submandibular space, posteriorly displacing the right submandibular gland. This is likely arising from the right mandibular second molar tooth (which has prominent surrounding periapical lucency with adjacent inferior cortical deficiency). 2. Surrounding inflammatory changes within the perimandibular region more anteriorly, and within the upper neck, consistent with cellulitis. Electronically Signed   By: Rockey Childs D.O.   On: 02/16/2024 14:31    Microbiology: Results for orders placed or performed during the hospital encounter of 02/16/24  Blood culture (routine x 2)     Status: None (Preliminary result)   Collection Time: 02/16/24 12:11 PM   Specimen: BLOOD  Result Value Ref Range Status   Specimen Description BLOOD RIGHT ANTECUBITAL  Final   Special Requests   Final    BOTTLES DRAWN AEROBIC AND ANAEROBIC Blood Culture adequate volume   Culture  Final    NO GROWTH 3 DAYS Performed at [facility], [address], [place], [place] [address]    Report Status PENDING  Incomplete  Blood culture (routine x 2)     Status: None (Preliminary result)   Collection Time: 02/16/24 12:16 PM   Specimen: BLOOD LEFT FOREARM  Result Value Ref Range Status   Specimen Description BLOOD LEFT FOREARM  Final    Special Requests   Final    BOTTLES DRAWN AEROBIC AND ANAEROBIC Blood Culture results may not be optimal due to an inadequate volume of blood received in culture bottles   Culture   Final    NO GROWTH 3 DAYS Performed at [facility], [address] [place], [place] [address]    Report Status PENDING  Incomplete  Aerobic/Anaerobic Culture w Gram Stain (surgical/deep wound)     Status: None (Preliminary result)   Collection Time: 02/16/24  6:02 PM   Specimen: Abscess  Result Value Ref Range Status   Specimen Description ABSCESS  Final   Special Requests NONE  Final   Gram Stain   Final    RARE WBC PRESENT, PREDOMINANTLY PMN MODERATE GRAM POSITIVE COCCI MODERATE GRAM NEGATIVE RODS    Culture   Final    ABUNDANT STREPTOCOCCI, ALPHA HEMOLYTIC MODERATE PREVOTELLA DENTICOLA BETA LACTAMASE POSITIVE SUSCEPTIBILITIES TO FOLLOW Performed at [facility], [address], [place], [place] [address]    Report Status PENDING  Incomplete    Labs: CBC: Recent Labs  Lab 02/16/24 1133 02/17/24 0656 02/18/24 0558 02/19/24 0522  WBC 14.5* 13.1* 8.7 7.3  NEUTROABS 12.2*  --  6.4 3.8  HGB 14.6 13.6 14.1 12.3*  HCT 41.2 39.6 41.8 36.4*  MCV 91.4 92.3 93.1 93.6  PLT 299 322 348 353   Basic Metabolic Panel: Recent Labs  Lab 02/16/24 1133 02/17/24 0656 02/18/24 0558 02/19/24 0522  NA 130* 138 136 138  K 3.7 3.6 3.0* 4.3  CL 93* 97* 97* 102  CO2 22 26 28 26   GLUCOSE 106* 115* 82 93  BUN 8 5* 5* <5*  CREATININE 0.76 0.59* 0.71 0.61  CALCIUM 9.7 9.5 9.5 9.6   Liver Function Tests: Recent Labs  Lab 02/16/24 1133 02/17/24 0656  AST 35 31  ALT 26 24  ALKPHOS 50 46  BILITOT 1.8* 0.8  PROT 8.1 7.3  ALBUMIN 4.1 3.6   CBG: No results for input(s): GLUCAP in the last 168 hours.  Discharge time spent: greater than 30 minutes.  Signed: Owen DELENA Lore, MD [facility] 02/19/2024

## 2024-02-19 NOTE — Discharge Summary (Incomplete)
 Physician Discharge Summary   Patient: Brett Grant MRN: [id number] DOB: 11-13-1965  Admit date:     02/16/2024  Discharge date: {dischdate:26783}  Discharge Physician: Owen DELENA Lore   PCP: Pcp, No   Recommendations at discharge:  {Tip this will not be part of the note when signed- Example include specific recommendations for outpatient follow-up, pending tests to follow-up on. (Optional):26781}  ***  Discharge Diagnoses: Principal Problem:   Mandibular abscess  Resolved Problems:   * No resolved hospital problems. *  Hospital Course: IMPRESSION: 1. Large abscess centered along the posterior body, angle and ramus of the mandible on the right, measuring 5.2 x 3.7 x 7.4 cm. The abscess laterally displaces, and likely involves from a the right masseter muscle. The abscess also extends inferiorly into the right submandibular space, posteriorly displacing the right submandibular gland. This is likely arising from the right mandibular second molar tooth (which has prominent surrounding periapical lucency with adjacent inferior cortical deficiency). 2. Surrounding inflammatory changes within the perimandibular region more anteriorly, and within the upper neck, consistent with cellulitis.  Assessment and Plan: No notes have been filed under this hospital service. Service: Hospitalist     {Tip this will not be part of the note when signed Body mass index is 21.52 kg/m. , ,  (Optional):26781}  {(NOTE) Pain control PDMP Statment (Optional):26782} Consultants: *** Procedures performed: ***  Disposition: {Plan; Disposition:26390} Diet recommendation:  Discharge Diet Orders (From admission, onward)     Start     Ordered   02/19/24 0000  Diet - low sodium heart healthy        02/19/24 1327           {Diet_Plan:26776} DISCHARGE MEDICATION: Allergies as of 02/19/2024   No Known Allergies      Medication List     TAKE these medications    acetaminophen  325  MG tablet Commonly known as: TYLENOL  Take 2 tablets (650 mg total) by mouth every 6 (six) hours as needed for mild pain (pain score 1-3) or fever (or Fever >/= 101).   amLODipine  10 MG tablet Commonly known as: NORVASC  Take 1 tablet (10 mg total) by mouth daily. Start taking on: February 20, 2024   amoxicillin -clavulanate 875-125 MG tablet Commonly known as: AUGMENTIN  Take 1 tablet by mouth 2 (two) times daily for 10 days.   multivitamin with minerals Tabs tablet Take 1 tablet by mouth daily. Start taking on: February 20, 2024   nicotine  21 mg/24hr patch Commonly known as: NICODERM CQ  - dosed in mg/24 hours Place 1 patch (21 mg total) onto the skin daily. Start taking on: February 20, 2024   oxyCODONE  5 MG immediate release tablet Commonly known as: Oxy IR/ROXICODONE  Take 1 tablet (5 mg total) by mouth every 6 (six) hours as needed for up to 3 days for moderate pain (pain score 4-6).        Follow-up Information     Helga Bettyann SQUIBB, DMD Follow up in 1 week(s).   Specialty: Oral Surgery Contact information: [address] [place] [address] [phone]                Discharge Exam: Fredricka Weights   02/16/24 1107  Weight: 70 kg   ***  Condition at discharge: {DC Condition:26389}  The results of significant diagnostics from this hospitalization (including imaging, microbiology, ancillary and laboratory) are listed below for reference.   Imaging Studies: DG Orthopantogram Result Date: 02/16/2024 CLINICAL DATA:  Face abscess. EXAM:  ORTHOPANTOGRAM/PANORAMIC COMPARISON:  None Available. FINDINGS: No fracture or bone lesion. No periapical lucency to suggest a root abscess. Missing bilateral mandibular premolars. IMPRESSION: 1. No evidence of the root abscess.  No acute finding. Electronically Signed   By: Alm Parkins M.D.   On: 02/16/2024 16:24   CT Soft Tissue Neck W Contrast Result Date: 02/16/2024 CLINICAL DATA:  Provided history: Soft tissue infection  suspected, neck, no prior imaging. EXAM: CT NECK WITH CONTRAST TECHNIQUE: Multidetector CT imaging of the neck was performed using the standard protocol following the bolus administration of intravenous contrast. RADIATION DOSE REDUCTION: This exam was performed according to the departmental dose-optimization program which includes automated exposure control, adjustment of the mA and/or kV according to patient size and/or use of iterative reconstruction technique. CONTRAST:  75mL OMNIPAQUE  IOHEXOL  350 MG/ML SOLN COMPARISON:  None. FINDINGS: Pharynx and larynx: Large abscess centered along the posterior body, angle and ramus of the mandible on the right, measuring 5.2 x 3.7 x 7.4 cm. The abscess laterally displaces, and likely involves, the right masseter muscle. The abscess also extends inferiorly into the right submandibular space, posteriorly displacing the right submandibular gland. The abscess is likely arising from the right mandibular second molar tooth (which has prominent surrounding periapical lucency with adjacent inferior cortical deficiency). Surrounding edema/inflammatory changes within the perimandibular region more anteriorly, and within the upper neck, consistent with cellulitis. No significant airway effacement. Salivary glands: The right parotid gland is laterally displaced by a large perimandibular abscess (described above). Unremarkable appearance of the left parotid and bilateral submandibular glands. Thyroid: Unremarkable. Lymph nodes: No pathologically enlarged lymph nodes identified within the neck. Vascular: The major vascular structures of the neck are patent. Minimal atherosclerotic plaque about the right carotid bifurcation. Limited intracranial: No evidence of an acute intracranial abnormality within the field of view. Visualized orbits: No orbital mass or acute orbital finding. Mastoids and visualized paranasal sinuses: Minimal mucosal thickening within the right maxillary sinus. No  significant mastoid effusion. Skeleton: Cervical spondylosis. Periapical lucency surrounding the right mandibular second molar tooth with adjacent inferior cortical deficiency (series 5, image 46). Upper chest: No consolidation within the imaged lung apices. IMPRESSION: 1. Large abscess centered along the posterior body, angle and ramus of the mandible on the right, measuring 5.2 x 3.7 x 7.4 cm. The abscess laterally displaces, and likely involves from a the right masseter muscle. The abscess also extends inferiorly into the right submandibular space, posteriorly displacing the right submandibular gland. This is likely arising from the right mandibular second molar tooth (which has prominent surrounding periapical lucency with adjacent inferior cortical deficiency). 2. Surrounding inflammatory changes within the perimandibular region more anteriorly, and within the upper neck, consistent with cellulitis. Electronically Signed   By: Rockey Childs D.O.   On: 02/16/2024 14:31    Microbiology: Results for orders placed or performed during the hospital encounter of 02/16/24  Blood culture (routine x 2)     Status: None (Preliminary result)   Collection Time: 02/16/24 12:11 PM   Specimen: BLOOD  Result Value Ref Range Status   Specimen Description BLOOD RIGHT ANTECUBITAL  Final   Special Requests   Final    BOTTLES DRAWN AEROBIC AND ANAEROBIC Blood Culture adequate volume   Culture   Final    NO GROWTH 3 DAYS Performed at [facility], [address], [place], [place] [address]    Report Status PENDING  Incomplete  Blood culture (routine x 2)     Status: None (Preliminary  result)   Collection Time: 02/16/24 12:16 PM   Specimen: BLOOD LEFT FOREARM  Result Value Ref Range Status   Specimen Description BLOOD LEFT FOREARM  Final   Special Requests   Final    BOTTLES DRAWN AEROBIC AND ANAEROBIC Blood Culture results may not be optimal due to an inadequate volume of blood received in culture bottles    Culture   Final    NO GROWTH 3 DAYS Performed at [facility], [address], [place], [place] [address]    Report Status PENDING  Incomplete  Aerobic/Anaerobic Culture w Gram Stain (surgical/deep wound)     Status: None (Preliminary result)   Collection Time: 02/16/24  6:02 PM   Specimen: Abscess  Result Value Ref Range Status   Specimen Description ABSCESS  Final   Special Requests NONE  Final   Gram Stain   Final    RARE WBC PRESENT, PREDOMINANTLY PMN MODERATE GRAM POSITIVE COCCI MODERATE GRAM NEGATIVE RODS    Culture   Final    ABUNDANT STREPTOCOCCI, ALPHA HEMOLYTIC SUSCEPTIBILITIES TO FOLLOW CULTURE REINCUBATED FOR BETTER GROWTH Performed at [facility], [address], [place], [place] [address]    Report Status PENDING  Incomplete    Labs: CBC: Recent Labs  Lab 02/16/24 1133 02/17/24 0656 02/18/24 0558 02/19/24 0522  WBC 14.5* 13.1* 8.7 7.3  NEUTROABS 12.2*  --  6.4 3.8  HGB 14.6 13.6 14.1 12.3*  HCT 41.2 39.6 41.8 36.4*  MCV 91.4 92.3 93.1 93.6  PLT 299 322 348 353   Basic Metabolic Panel: Recent Labs  Lab 02/16/24 1133 02/17/24 0656 02/18/24 0558 02/19/24 0522  NA 130* 138 136 138  K 3.7 3.6 3.0* 4.3  CL 93* 97* 97* 102  CO2 22 26 28 26   GLUCOSE 106* 115* 82 93  BUN 8 5* 5* <5*  CREATININE 0.76 0.59* 0.71 0.61  CALCIUM 9.7 9.5 9.5 9.6   Liver Function Tests: Recent Labs  Lab 02/16/24 1133 02/17/24 0656  AST 35 31  ALT 26 24  ALKPHOS 50 46  BILITOT 1.8* 0.8  PROT 8.1 7.3  ALBUMIN 4.1 3.6   CBG: No results for input(s): GLUCAP in the last 168 hours.  Discharge time spent: {LESS THAN/GREATER UYJW:73611} 30 minutes.  Signed: Owen DELENA Lore, MD [facility] 02/19/2024

## 2024-02-21 LAB — CULTURE, BLOOD (ROUTINE X 2)
Culture: NO GROWTH
Culture: NO GROWTH
Special Requests: ADEQUATE

## 2024-02-22 LAB — AEROBIC/ANAEROBIC CULTURE W GRAM STAIN (SURGICAL/DEEP WOUND)
